# Patient Record
Sex: Male | Born: 1980 | Hispanic: Yes | Marital: Single | Smoking: Never smoker
Health system: Southern US, Community
[De-identification: ages and names within clinical notes are randomized; demographics above are authoritative.]

## PROBLEM LIST (undated history)

## (undated) DIAGNOSIS — K760 Fatty (change of) liver, not elsewhere classified: Secondary | ICD-10-CM

## (undated) DIAGNOSIS — R7989 Other specified abnormal findings of blood chemistry: Secondary | ICD-10-CM

## (undated) DIAGNOSIS — Z6833 Body mass index (BMI) 33.0-33.9, adult: Secondary | ICD-10-CM

## (undated) HISTORY — DX: Fatty (change of) liver, not elsewhere classified: K76.0

## (undated) HISTORY — DX: Other specified abnormal findings of blood chemistry: R79.89

## (undated) HISTORY — DX: Body mass index (BMI) 33.0-33.9, adult: Z68.33

## (undated) HISTORY — PX: NO PAST SURGERIES: SHX2092

---

## 2002-03-15 ENCOUNTER — Encounter: Payer: Self-pay | Admitting: Emergency Medicine

## 2002-03-15 ENCOUNTER — Emergency Department (HOSPITAL_COMMUNITY): Admission: EM | Admit: 2002-03-15 | Discharge: 2002-03-15 | Payer: Self-pay | Admitting: Emergency Medicine

## 2016-11-23 ENCOUNTER — Emergency Department (HOSPITAL_COMMUNITY)
Admission: EM | Admit: 2016-11-23 | Discharge: 2016-11-23 | Disposition: A | Discharge status: Home / Self Care | Payer: Managed Care, Other (non HMO) | Attending: Emergency Medicine | Admitting: Emergency Medicine

## 2016-11-23 ENCOUNTER — Encounter (HOSPITAL_COMMUNITY): Payer: Self-pay | Admitting: *Deleted

## 2016-11-23 DIAGNOSIS — L0291 Cutaneous abscess, unspecified: Secondary | ICD-10-CM

## 2016-11-23 DIAGNOSIS — L02215 Cutaneous abscess of perineum: Secondary | ICD-10-CM | POA: Diagnosis present

## 2016-11-23 MED ORDER — AMOXICILLIN-POT CLAVULANATE 875-125 MG PO TABS
1.0000 | ORAL_TABLET | Freq: Once | ORAL | Status: AC
  Administered 2016-11-23: 1 via ORAL
  Filled 2016-11-23: qty 1

## 2016-11-23 MED ORDER — LIDOCAINE HCL (PF) 1 % IJ SOLN
10.0000 mL | Freq: Once | INTRAMUSCULAR | Status: DC
  Filled 2016-11-23: qty 10

## 2016-11-23 MED ORDER — AMOXICILLIN-POT CLAVULANATE 875-125 MG PO TABS: 1.0000 | ORAL_TABLET | Freq: Two times a day (BID) | ORAL | 0 refills | Status: DC

## 2016-11-23 MED ORDER — LIDOCAINE HCL (PF) 2 % IJ SOLN
INTRAMUSCULAR | Status: AC
  Administered 2016-11-23: 10 mL
  Filled 2016-11-23: qty 10

## 2016-11-23 MED ORDER — OXYCODONE-ACETAMINOPHEN 5-325 MG PO TABS: 1.0000 | ORAL_TABLET | Freq: Three times a day (TID) | ORAL | 0 refills | Status: DC | PRN

## 2016-11-23 MED ORDER — OXYCODONE-ACETAMINOPHEN 5-325 MG PO TABS
1.0000 | ORAL_TABLET | Freq: Once | ORAL | Status: AC
  Administered 2016-11-23: 1 via ORAL
  Filled 2016-11-23: qty 1

## 2016-11-23 NOTE — ED Provider Notes (Signed)
[facility]PT Provider Note   CSN: [id number] Arrival date & time: 11/23/16  2146     History   Chief Complaint Chief Complaint  Patient presents with  . Abscess    HPI Adam Zimmerman is a 36 y.o. male.   Abscess  Location:  Pelvis Pelvic abscess location:  Perineum Size:  3x1 Abscess quality: fluctuance, induration, painful, redness and warmth   Red streaking: no   Duration:  5 days Progression:  Worsening Pain details:    Quality:  Sharp and dull   Severity:  Mild   Timing:  Constant   Progression:  Worsening Chronicity:  New   History reviewed. No pertinent past medical history.  There are no active problems to display for this patient.   History reviewed. No pertinent surgical history.     Home Medications    Prior to Admission medications   Medication Sig Start Date End Date Taking? Authorizing Provider  doxycycline (VIBRA-TABS) 100 MG tablet Take 100 mg by mouth 2 (two) times daily. Take 1 tablet twice a day for 10 days   Yes [provider]  ibuprofen (ADVIL,MOTRIN) 800 MG tablet Take 600 mg by mouth every 6 (six) hours as needed for headache or moderate pain.   Yes [provider]  amoxicillin-clavulanate (AUGMENTIN) 875-125 MG tablet Take 1 tablet by mouth 2 (two) times daily. One po bid x 7 days 11/23/16   Keilyn Haggard, Barbara CowerJason, MD  oxyCODONE-acetaminophen (PERCOCET) 5-325 MG tablet Take 1-2 tablets by mouth every 8 (eight) hours as needed for severe pain. 11/23/16   Avenell Sellers, Barbara CowerJason, MD    Family History No family history on file.  Social History Social History  Substance Use Topics  . Smoking status: Never Smoker  . Smokeless tobacco: Never Used  . Alcohol use Not on file     Allergies   Patient has no known allergies.   Review of Systems Review of Systems  All other systems reviewed and are negative.    Physical Exam Updated Vital Signs BP (!) 153/79   Pulse 92   Temp 98.2 F (36.8 C) (Oral)   Resp (!) 156   SpO2  97%   Physical Exam  Constitutional: He appears well-developed and well-nourished.  HENT:  Head: Normocephalic and atraumatic.  Eyes: Conjunctivae and EOM are normal.  Neck: Normal range of motion.  Cardiovascular: Normal rate.   Pulmonary/Chest: Effort normal. No respiratory distress.  Abdominal: Soft. Bowel sounds are normal. He exhibits no distension. There is no tenderness.  Genitourinary:    Right testis shows no mass. Left testis shows no mass.  Genitourinary Comments: Small area of fluctuance in area of exclamation point Surrounding erythema, edema, tenderness   Musculoskeletal: Normal range of motion.  Neurological: He is alert.  Nursing note and vitals reviewed.    ED Treatments / Results  Labs (all labs ordered are listed, but only abnormal results are displayed) Labs Reviewed - No data to display  EKG  EKG Interpretation None       Radiology No results found.  Procedures .Marland Kitchen.Incision and Drainage Date/Time: 11/23/2016 10:44 PM Performed by: Marily MemosMESNER, Hameed Kolar Authorized by: Marily MemosMESNER, Sumayya Muha   Consent:    Consent obtained:  Verbal   Consent given by:  Patient   Risks discussed:  Bleeding, incomplete drainage, infection and pain   Alternatives discussed:  No treatment Location:    Type:  Abscess   Size:  1X3   Location:  Anogenital   Anogenital location:  Perineum Pre-procedure details:  Skin preparation:  Antiseptic wash Anesthesia (see MAR for exact dosages):    Anesthesia method:  Local infiltration   Local anesthetic:  Lidocaine 1% w/o epi Procedure type:    Complexity:  Simple Procedure details:    Needle aspiration: yes     Needle size:  18 G   Incision types:  Stab incision   Scalpel blade:  11   Drainage:  Bloody and purulent   Drainage amount:  Moderate   Wound treatment:  Wound left open   Packing materials:  None Post-procedure details:    Patient tolerance of procedure:  Tolerated well, no immediate complications   EMERGENCY  DEPARTMENT US SOFT TISSUE INTERPRETATION "Study: Limited Soft Tissue Ultrasound"  INDICATIONS: Pain and Soft tissue infection Multiple views of the body part were obtained in real-time with a multi-frequency linear probe  PERFORMED BY: Myself IMAGES ARCHIVED?: Yes SIDE:Right  BODY PART:perineum INTERPRETATION:  Abcess present and Cellulitis present     Medications Ordered in ED Medications  lidocaine (PF) (XYLOCAINE) 1 % injection 10 mL (10 mLs Intradermal Not Given 11/23/16 2223)  amoxicillin-clavulanate (AUGMENTIN) 875-125 MG per tablet 1 tablet (not administered)  oxyCODONE-acetaminophen (PERCOCET/ROXICET) 5-325 MG per tablet 1 tablet (1 tablet Oral Given 11/23/16 2222)  lidocaine (XYLOCAINE) 2 % injection (10 mLs  Given 11/23/16 2223)     Initial Impression / Assessment and Plan / ED Course  I have reviewed the triage vital signs and the nursing notes.  Pertinent labs & imaging results that were available during my care of the patient were reviewed by me and considered in my medical decision making (see chart for details).   Perineal abscess, on doxy for a day. Discussed with Dr. Lovell Sheehan and will make small stab incisiion to start draining process and he will follow up Tuesday. Requested adding on augmentin to antibiotics. Will also give rx for pain meds as well.   Final Clinical Impressions(s) / ED Diagnoses   Final diagnoses:  Abscess    New Prescriptions New Prescriptions   AMOXICILLIN-CLAVULANATE (AUGMENTIN) 875-125 MG TABLET    Take 1 tablet by mouth 2 (two) times daily. One po bid x 7 days   OXYCODONE-ACETAMINOPHEN (PERCOCET) 5-325 MG TABLET    Take 1-2 tablets by mouth every 8 (eight) hours as needed for severe pain.     Marily Memos, MD 11/23/16 2249

## 2016-11-23 NOTE — ED Triage Notes (Signed)
Pt c/o rectal abscess that started a few days ago, [place] on doxycycline but pt reports that the area has spread,

## 2016-11-25 ENCOUNTER — Encounter: Payer: Self-pay | Admitting: General Surgery

## 2016-11-25 ENCOUNTER — Ambulatory Visit (INDEPENDENT_AMBULATORY_CARE_PROVIDER_SITE_OTHER): Payer: Managed Care, Other (non HMO) | Admitting: General Surgery

## 2016-11-25 VITALS — BP 133/79 | HR 86 | Temp 98.6°F | Resp 18 | Ht 66.0 in | Wt 203.0 lb

## 2016-11-25 DIAGNOSIS — L02215 Cutaneous abscess of perineum: Secondary | ICD-10-CM | POA: Diagnosis not present

## 2016-11-26 NOTE — Progress Notes (Signed)
Adam Zimmerman; [id number]; 11/29/1980   HPI Patient is a 36 year old Hispanic male who underwent incision and drainage of a perineal abscess in the emergency room on 11/23/2016. He was referred by the emergency room for follow-up wound check. Patient states that the drainage from the wound has decreased significantly over the past 24 hours. He denies any fevers. He just got his antibiotics filled. He denies any pain at the present time. He has never had this happen before. No past medical history on file.  No past surgical history on file.  No family history on file.  Current Outpatient Prescriptions on File Prior to Visit  Medication Sig Dispense Refill  . amoxicillin-clavulanate (AUGMENTIN) 875-125 MG tablet Take 1 tablet by mouth 2 (two) times daily. One po bid x 7 days 14 tablet 0  . doxycycline (VIBRA-TABS) 100 MG tablet Take 100 mg by mouth 2 (two) times daily. Take 1 tablet twice a day for 10 days    . ibuprofen (ADVIL,MOTRIN) 800 MG tablet Take 600 mg by mouth every 6 (six) hours as needed for headache or moderate pain.    Marland Kitchen. oxyCODONE-acetaminophen (PERCOCET) 5-325 MG tablet Take 1-2 tablets by mouth every 8 (eight) hours as needed for severe pain. 15 tablet 0   No current facility-administered medications on file prior to visit.     No Known Allergies  History  Alcohol use Not on file    History  Smoking Status  . Never Smoker  Smokeless Tobacco  . Never Used    Review of Systems  Constitutional: Negative.   HENT: Negative.   Eyes: Negative.   Respiratory: Negative.   Cardiovascular: Negative.   Gastrointestinal: Positive for heartburn.  Musculoskeletal: Negative.   Skin: Negative.   Neurological: Negative.   Endo/Heme/Allergies: Negative.   Psychiatric/Behavioral: Negative.     Objective   Vitals:   11/25/16 1414  BP: 133/79  Pulse: 86  Resp: 18  Temp: 98.6 F (37 C)    Physical Exam  Constitutional: He is well-developed, well-nourished, and in no  distress.  HENT:  Head: Normocephalic and atraumatic.  Cardiovascular: Normal rate, regular rhythm and normal heart sounds.   No murmur heard. Pulmonary/Chest: Effort normal and breath sounds normal. He has no wheezes. He has no rales.  Genitourinary:  Genitourinary Comments: Perineal wound healing well without induration or drainage.  Neurological: He is alert.  Skin: Skin is warm and dry.  Vitals reviewed.  ER note reviewed. Assessment  Perineal abscess, resolving Plan   Finish antibiotic course. Sitz baths as needed. Follow-up here as needed.

## 2018-04-09 ENCOUNTER — Emergency Department (HOSPITAL_COMMUNITY)
Admission: EM | Admit: 2018-04-09 | Discharge: 2018-04-09 | Disposition: A | Discharge status: Home / Self Care | Payer: Managed Care, Other (non HMO) | Attending: Emergency Medicine | Admitting: Emergency Medicine

## 2018-04-09 ENCOUNTER — Emergency Department (HOSPITAL_COMMUNITY): Payer: Managed Care, Other (non HMO)

## 2018-04-09 ENCOUNTER — Other Ambulatory Visit: Payer: Self-pay

## 2018-04-09 ENCOUNTER — Encounter (HOSPITAL_COMMUNITY): Payer: Self-pay | Admitting: Emergency Medicine

## 2018-04-09 DIAGNOSIS — R1032 Left lower quadrant pain: Secondary | ICD-10-CM | POA: Diagnosis present

## 2018-04-09 DIAGNOSIS — R1030 Lower abdominal pain, unspecified: Secondary | ICD-10-CM

## 2018-04-09 LAB — CBC WITH DIFFERENTIAL/PLATELET
ABS IMMATURE GRANULOCYTES: 0.01 10*3/uL (ref 0.00–0.07)
Basophils Absolute: 0.1 10*3/uL (ref 0.0–0.1)
Basophils Relative: 1 %
Eosinophils Absolute: 0.3 10*3/uL (ref 0.0–0.5)
Eosinophils Relative: 4 %
HEMATOCRIT: 44.6 % (ref 39.0–52.0)
HEMOGLOBIN: 14.5 g/dL (ref 13.0–17.0)
Immature Granulocytes: 0 %
LYMPHS ABS: 3.8 10*3/uL (ref 0.7–4.0)
Lymphocytes Relative: 48 %
MCH: 29.2 pg (ref 26.0–34.0)
MCHC: 32.5 g/dL (ref 30.0–36.0)
MCV: 89.9 fL (ref 80.0–100.0)
Monocytes Absolute: 0.5 10*3/uL (ref 0.1–1.0)
Monocytes Relative: 6 %
NEUTROS ABS: 3.2 10*3/uL (ref 1.7–7.7)
Neutrophils Relative %: 41 %
Platelets: 309 10*3/uL (ref 150–400)
RBC: 4.96 MIL/uL (ref 4.22–5.81)
RDW: 13.2 % (ref 11.5–15.5)
WBC: 7.8 10*3/uL (ref 4.0–10.5)
nRBC: 0 % (ref 0.0–0.2)

## 2018-04-09 LAB — URINALYSIS, ROUTINE W REFLEX MICROSCOPIC
Bilirubin Urine: NEGATIVE
GLUCOSE, UA: NEGATIVE mg/dL
HGB URINE DIPSTICK: NEGATIVE
KETONES UR: NEGATIVE mg/dL
LEUKOCYTES UA: NEGATIVE
Nitrite: NEGATIVE
PH: 6 (ref 5.0–8.0)
Protein, ur: NEGATIVE mg/dL
Specific Gravity, Urine: 1.015 (ref 1.005–1.030)

## 2018-04-09 LAB — COMPREHENSIVE METABOLIC PANEL
ALK PHOS: 47 U/L (ref 38–126)
ALT: 79 U/L — AB (ref 0–44)
AST: 38 U/L (ref 15–41)
Albumin: 4.6 g/dL (ref 3.5–5.0)
Anion gap: 7 (ref 5–15)
BILIRUBIN TOTAL: 0.7 mg/dL (ref 0.3–1.2)
BUN: 17 mg/dL (ref 6–20)
CO2: 26 mmol/L (ref 22–32)
CREATININE: 1.04 mg/dL (ref 0.61–1.24)
Calcium: 9.2 mg/dL (ref 8.9–10.3)
Chloride: 107 mmol/L (ref 98–111)
GFR calc Af Amer: >60 mL/min (ref >60)
Glucose, Bld: 106 mg/dL — ABNORMAL HIGH (ref 70–99)
Potassium: 3.5 mmol/L (ref 3.5–5.1)
Sodium: 140 mmol/L (ref 135–145)
TOTAL PROTEIN: 8 g/dL (ref 6.5–8.1)

## 2018-04-09 MED ORDER — ONDANSETRON HCL 4 MG/2ML IJ SOLN
4.0000 mg | Freq: Once | INTRAMUSCULAR | Status: AC
  Administered 2018-04-09: 4 mg via INTRAVENOUS
  Filled 2018-04-09: qty 2

## 2018-04-09 MED ORDER — IBUPROFEN 800 MG PO TABS: 800.0000 mg | ORAL_TABLET | Freq: Three times a day (TID) | ORAL | 0 refills | Status: DC | PRN

## 2018-04-09 MED ORDER — KETOROLAC TROMETHAMINE 30 MG/ML IJ SOLN
30.0000 mg | Freq: Once | INTRAMUSCULAR | Status: AC
  Administered 2018-04-09: 30 mg via INTRAVENOUS
  Filled 2018-04-09: qty 1

## 2018-04-09 NOTE — ED Triage Notes (Signed)
Pt c/o left sided flank pain that goes around to his back x 2 hours, denies n/v/d/fever

## 2018-04-09 NOTE — ED Provider Notes (Addendum)
[facility] EMERGENCY DEPARTMENT Provider Note   CSN: [id number] Arrival date & time: 04/09/18  1943     History   Chief Complaint Chief Complaint  Patient presents with  . Flank Pain    HPI Adam Zimmerman is a 37 y.o. male.  Patient complains of left lower quadrant and left flank pain.  Moderate to severe.  The history is provided by the patient. No language interpreter was used.  Flank Pain  This is a new problem. The current episode started more than 2 days ago. The problem occurs constantly. The problem has not changed since onset.Associated symptoms include abdominal pain. Pertinent negatives include no chest pain and no headaches. Nothing aggravates the symptoms. Nothing relieves the symptoms. He has tried nothing for the symptoms. The treatment provided no relief.    History reviewed. No pertinent past medical history.  There are no active problems to display for this patient.   History reviewed. No pertinent surgical history.      Home Medications    Prior to Admission medications   Medication Sig Start Date End Date Taking? Authorizing Provider  UNKNOWN TO PATIENT Take 1 tablet by mouth 2 (two) times daily. 10 day course completed on 04/08/2018-Antibiotic--Name is unknown    [provider]    Family History History reviewed. No pertinent family history.  Social History Social History   Tobacco Use  . Smoking status: Never Smoker  . Smokeless tobacco: Never Used  Substance Use Topics  . Alcohol use: Yes    Comment: occassional   . Drug use: Never     Allergies   Amoxicillin   Review of Systems Review of Systems  Constitutional: Negative for appetite change and fatigue.  HENT: Negative for congestion, ear discharge and sinus pressure.   Eyes: Negative for discharge.  Respiratory: Negative for cough.   Cardiovascular: Negative for chest pain.  Gastrointestinal: Positive for abdominal pain. Negative for diarrhea.  Genitourinary:  Positive for flank pain. Negative for frequency and hematuria.  Musculoskeletal: Negative for back pain.  Skin: Negative for rash.  Neurological: Negative for seizures and headaches.  Psychiatric/Behavioral: Negative for hallucinations.     Physical Exam Updated Vital Signs BP (!) 142/79 (BP Location: Right Arm)   Pulse 68   Temp 98.3 F (36.8 C) (Oral)   Resp 18   Ht 5\' 7"  (1.702 m)   Wt 95.3 kg   SpO2 99%   BMI 32.89 kg/m   Physical Exam  Constitutional: He is oriented to person, place, and time. He appears well-developed.  HENT:  Head: Normocephalic.  Eyes: Conjunctivae and EOM are normal. No scleral icterus.  Neck: Neck supple. No thyromegaly present.  Cardiovascular: Normal rate and regular rhythm. Exam reveals no gallop and no friction rub.  No murmur heard. Pulmonary/Chest: No stridor. He has no wheezes. He has no rales. He exhibits no tenderness.  Abdominal: He exhibits no distension. There is tenderness. There is no rebound.  Tenderness to left lower quadrant  Genitourinary:  Genitourinary Comments: The left flank  Musculoskeletal: Normal range of motion. He exhibits no edema.  Lymphadenopathy:    He has no cervical adenopathy.  Neurological: He is oriented to person, place, and time. He exhibits normal muscle tone. Coordination normal.  Skin: No rash noted. No erythema.  Psychiatric: He has a normal mood and affect. His behavior is normal.     ED Treatments / Results  Labs (all labs ordered are listed, but only abnormal results are displayed) Labs Reviewed  COMPREHENSIVE METABOLIC PANEL - Abnormal; Notable for the following components:      Result Value   Glucose, Bld 106 (*)    ALT 79 (*)    All other components within normal limits  URINALYSIS, ROUTINE W REFLEX MICROSCOPIC  CBC WITH DIFFERENTIAL/PLATELET    EKG None  Radiology No results found.  Procedures Procedures (including critical care time)  Medications Ordered in ED Medications    ketorolac (TORADOL) 30 MG/ML injection 30 mg (30 mg Intravenous Given 04/09/18 2037)  ondansetron (ZOFRAN) injection 4 mg (4 mg Intravenous Given 04/09/18 2037)     Initial Impression / Assessment and Plan / ED Course  I have reviewed the triage vital signs and the nursing notes.  Pertinent labs & imaging results that were available during my care of the patient were reviewed by me and considered in my medical decision making (see chart for details).     Patient with left flank pain.  CT scan pending labs unremarkable CT scan negative.  Patient will be placed on Motrin for discomfort.  Suspect muscle skeletal pain Final Clinical Impressions(s) / ED Diagnoses   Final diagnoses:  None    ED Discharge Orders    None       Bethann Berkshire, MD 04/09/18 2200    Bethann Berkshire, MD 04/09/18 2215

## 2018-04-09 NOTE — ED Notes (Signed)
Patient transported to CT 

## 2018-04-09 NOTE — Discharge Instructions (Addendum)
Follow-up with Dr. Jena Gaussourk in the next few weeks if not improving

## 2019-01-17 ENCOUNTER — Other Ambulatory Visit: Payer: Self-pay

## 2019-01-17 DIAGNOSIS — Z20822 Contact with and (suspected) exposure to covid-19: Secondary | ICD-10-CM

## 2019-01-18 LAB — NOVEL CORONAVIRUS, NAA: SARS-CoV-2, NAA: NOT DETECTED

## 2019-06-21 ENCOUNTER — Other Ambulatory Visit: Payer: Self-pay

## 2019-06-21 ENCOUNTER — Ambulatory Visit
Admission: EM | Admit: 2019-06-21 | Discharge: 2019-06-21 | Disposition: A | Discharge status: Home / Self Care | Payer: Managed Care, Other (non HMO) | Attending: Emergency Medicine | Admitting: Emergency Medicine

## 2019-06-21 DIAGNOSIS — Z20822 Contact with and (suspected) exposure to covid-19: Secondary | ICD-10-CM | POA: Diagnosis not present

## 2019-06-21 MED ORDER — BENZONATATE 100 MG PO CAPS: 100.0000 mg | ORAL_CAPSULE | Freq: Three times a day (TID) | ORAL | 0 refills | Status: DC

## 2019-06-21 NOTE — ED Provider Notes (Signed)
[facility]    CSN: [id number] Arrival date & time: 06/21/19  1909      History   Chief Complaint Chief Complaint  Patient presents with  . Cough    HPI Adam Zimmerman is a 39 y.o. male.   who presents with a complaint of body aches for 2 days and cough that started today.  Denies sick exposure to COVID, flu or strep.  Denies recent travel.  Denies aggravating or alleviating symptoms.  Denies previous COVID infection.   Denies fever, chills, fatigue, nasal congestion, rhinorrhea, sore throat, SOB, wheezing, chest pain, nausea, vomiting, changes in bowel or bladder habits.    The history is provided by the patient. No language interpreter was used.    History reviewed. No pertinent past medical history.  There are no problems to display for this patient.   History reviewed. No pertinent surgical history.     Home Medications    Prior to Admission medications   Medication Sig Start Date End Date Taking? Authorizing Provider  benzonatate (TESSALON) 100 MG capsule Take 1 capsule (100 mg total) by mouth every 8 (eight) hours. 06/21/19   Kaven Cumbie, Darrelyn Hillock, FNP  ibuprofen (ADVIL,MOTRIN) 800 MG tablet Take 1 tablet (800 mg total) by mouth every 8 (eight) hours as needed. 04/09/18   Milton Ferguson, MD  UNKNOWN TO PATIENT Take 1 tablet by mouth 2 (two) times daily. 10 day course completed on 04/08/2018-Antibiotic--Name is unknown    [provider]    Family History Family History  Problem Relation Age of Onset  . Healthy Mother   . Healthy Father     Social History Social History   Tobacco Use  . Smoking status: Never Smoker  . Smokeless tobacco: Never Used  Substance Use Topics  . Alcohol use: Yes    Comment: occassional   . Drug use: Never     Allergies   Amoxicillin   Review of Systems Review of Systems  Constitutional: Negative.   HENT: Negative.   Respiratory: Positive for cough.   Cardiovascular: Negative.   Gastrointestinal:  Negative.   Musculoskeletal:       Body aches  Neurological: Negative.      Physical Exam Triage Vital Signs ED Triage Vitals  Enc Vitals Group     BP      Pulse      Resp      Temp      Temp src      SpO2      Weight      Height      Head Circumference      Peak Flow      Pain Score      Pain Loc      Pain Edu?      Excl. in [place]?    No data found.  Updated Vital Signs BP 127/82 (BP Location: Right Arm)   Pulse 88   Temp 98.6 F (37 C) (Oral)   Resp 16   SpO2 95%   Visual Acuity Right Eye Distance:   Left Eye Distance:   Bilateral Distance:    Right Eye Near:   Left Eye Near:    Bilateral Near:     Physical Exam Vitals and nursing note reviewed.  Constitutional:      General: He is not in acute distress.    Appearance: Normal appearance. He is normal weight. He is not ill-appearing or toxic-appearing.  HENT:     Head: Normocephalic.  Right Ear: Tympanic membrane, ear canal and external ear normal. There is no impacted cerumen.     Left Ear: Tympanic membrane, ear canal and external ear normal. There is no impacted cerumen.     Nose: Nose normal. No congestion.     Mouth/Throat:     Mouth: Mucous membranes are moist.     Pharynx: Oropharynx is clear. No oropharyngeal exudate or posterior oropharyngeal erythema.  Cardiovascular:     Rate and Rhythm: Normal rate and regular rhythm.     Pulses: Normal pulses.     Heart sounds: Normal heart sounds. No murmur.  Pulmonary:     Effort: Pulmonary effort is normal. No respiratory distress.     Breath sounds: Normal breath sounds. No wheezing or rhonchi.  Chest:     Chest wall: No tenderness.  Abdominal:     General: Abdomen is flat. Bowel sounds are normal. There is no distension.     Palpations: There is no mass.     Tenderness: There is no abdominal tenderness.  Skin:    Capillary Refill: Capillary refill takes less than 2 seconds.  Neurological:     General: No focal deficit present.     Mental  Status: He is alert and oriented to person, place, and time.      UC Treatments / Results  Labs (all labs ordered are listed, but only abnormal results are displayed) Labs Reviewed  NOVEL CORONAVIRUS, NAA    EKG   Radiology No results found.  Procedures Procedures (including critical care time)  Medications Ordered in UC Medications - No data to display  Initial Impression / Assessment and Plan / UC Course  I have reviewed the triage vital signs and the nursing notes.  Pertinent labs & imaging results that were available during my care of the patient were reviewed by me and considered in my medical decision making (see chart for details).     COVID-19 test was ordered Tessalon Perles prescribed for cough Advised patient to quarantine To go to ED for worsening of symptoms Patient verbalized an understanding of the plan of care  Final Clinical Impressions(s) / UC Diagnoses   Final diagnoses:  Suspected COVID-19 virus infection     Discharge Instructions     COVID testing ordered.  It will take between 5-7 days for test results.  Someone will contact you regarding abnormal results.    In the meantime: You should remain isolated in your home for 10 days from symptom onset AND greater than 72 hours after symptoms resolution (absence of fever without the use of fever-reducing medication and improvement in respiratory symptoms), whichever is longer Get plenty of rest and push fluids Use medications daily for symptom relief Use OTC medications like ibuprofen or tylenol as needed fever or pain Call or go to the ED if you have any new or worsening symptoms such as fever, worsening cough, shortness of breath, chest tightness, chest pain, turning blue, changes in mental status, etc...     ED Prescriptions    Medication Sig Dispense Auth. Provider   benzonatate (TESSALON) 100 MG capsule Take 1 capsule (100 mg total) by mouth every 8 (eight) hours. 21 capsule Juanice Warburton,  Zachery Dakins, FNP     PDMP not reviewed this encounter.   Durward Parcel, FNP 06/21/19 1932

## 2019-06-21 NOTE — ED Triage Notes (Signed)
Pt presents to UC for covid test. Pt states he had body aches for 2 days last week. Pt sttes he's been coughing that started today.

## 2019-06-21 NOTE — Discharge Instructions (Addendum)

## 2019-06-22 LAB — NOVEL CORONAVIRUS, NAA: SARS-CoV-2, NAA: DETECTED — AB

## 2019-06-24 ENCOUNTER — Telehealth (HOSPITAL_COMMUNITY): Payer: Self-pay | Admitting: Emergency Medicine

## 2019-06-24 NOTE — Telephone Encounter (Signed)
Your test for COVID-19 was positive, meaning that you were infected with the novel coronavirus and could give the germ to others.  Please continue isolation at home for at least 10 days since the start of your symptoms. If you do not have symptoms, please isolate at home for 10 days from the day you were tested. Once you complete your 10 day quarantine, you may return to normal activities as long as you've not had a fever for over 24 hours(without taking fever reducing medicine) and your symptoms are improving. Please continue good preventive care measures, including:  frequent hand-washing, avoid touching your face, cover coughs/sneezes, stay out of crowds and keep a 6 foot distance from others.  Go to the nearest hospital emergency room if fever/cough/breathlessness are severe or illness seems like a threat to life.  With spanish interpreter, Patient contacted by phone and made aware of    results. Pt verbalized understanding and had all questions answered.

## 2020-01-30 ENCOUNTER — Ambulatory Visit
Admission: EM | Admit: 2020-01-30 | Discharge: 2020-01-30 | Disposition: A | Discharge status: Home / Self Care | Payer: Managed Care, Other (non HMO) | Attending: Emergency Medicine | Admitting: Emergency Medicine

## 2020-01-30 DIAGNOSIS — H60392 Other infective otitis externa, left ear: Secondary | ICD-10-CM

## 2020-01-30 MED ORDER — CIPROFLOXACIN-DEXAMETHASONE 0.3-0.1 % OT SUSP: 4.0000 [drp] | Freq: Two times a day (BID) | OTIC | 0 refills | Status: DC

## 2020-01-30 NOTE — ED Triage Notes (Signed)
Pt presents with c/o left ear pain that began yesterday, states he had green drainage

## 2020-01-30 NOTE — Discharge Instructions (Addendum)
Rest and drink plenty of fluids Prescribed Ciprodex ear drops Take medications as directed and to completion Continue to use OTC ibuprofen and/ or tylenol as needed for pain control Follow up with PCP if symptoms persists Return here or go to the ER if you have any new or worsening symptoms  

## 2020-01-30 NOTE — ED Provider Notes (Signed)
[facility]   [id number] 01/30/20 Arrival Time: 1009  CC:EAR PAIN  SUBJECTIVE: History from: patient.  Adam Zimmerman is a 39 y.o. male who presents with of left  ear pain for the past few days.  Denies a precipitating event, such as swimming or wearing ear plugs.  Patient states the pain is constant and achy in character.  Start OTC medication with no relief.  Symptoms are made worse with lying down.  Denies similar symptoms in the past.    Denies fever, chills, fatigue, sinus pain, rhinorrhea, ear discharge, sore throat, SOB, wheezing, chest pain, nausea, changes in bowel or bladder habits.    ROS: As per HPI.  All other pertinent ROS negative.     No past medical history on file. No past surgical history on file. Allergies  Allergen Reactions  . Amoxicillin Hives   No current facility-administered medications on file prior to encounter.   Current Outpatient Medications on File Prior to Encounter  Medication Sig Dispense Refill  . benzonatate (TESSALON) 100 MG capsule Take 1 capsule (100 mg total) by mouth every 8 (eight) hours. 21 capsule 0  . ibuprofen (ADVIL,MOTRIN) 800 MG tablet Take 1 tablet (800 mg total) by mouth every 8 (eight) hours as needed. 20 tablet 0  . UNKNOWN TO PATIENT Take 1 tablet by mouth 2 (two) times daily. 10 day course completed on 04/08/2018-Antibiotic--Name is unknown     Social History   Socioeconomic History  . Marital status: Single    Spouse name: Not on file  . Number of children: Not on file  . Years of education: Not on file  . Highest education level: Not on file  Occupational History  . Not on file  Tobacco Use  . Smoking status: Never Smoker  . Smokeless tobacco: Never Used  Vaping Use  . Vaping Use: Never used  Substance and Sexual Activity  . Alcohol use: Yes    Comment: occassional   . Drug use: Never  . Sexual activity: Not on file  Other Topics Concern  . Not on file  Social History Narrative  . Not on file    Social Determinants of Health   Financial Resource Strain:   . Difficulty of Paying Living Expenses: Not on file  Food Insecurity:   . Worried About Programme researcher, broadcasting/film/video in the Last Year: Not on file  . Ran Out of Food in the Last Year: Not on file  Transportation Needs:   . Lack of Transportation (Medical): Not on file  . Lack of Transportation (Non-Medical): Not on file  Physical Activity:   . Days of Exercise per Week: Not on file  . Minutes of Exercise per Session: Not on file  Stress:   . Feeling of Stress : Not on file  Social Connections:   . Frequency of Communication with Friends and Family: Not on file  . Frequency of Social Gatherings with Friends and Family: Not on file  . Attends Religious Services: Not on file  . Active Member of Clubs or Organizations: Not on file  . Attends Banker Meetings: Not on file  . Marital Status: Not on file  Intimate Partner Violence:   . Fear of Current or Ex-Partner: Not on file  . Emotionally Abused: Not on file  . Physically Abused: Not on file  . Sexually Abused: Not on file   Family History  Problem Relation Age of Onset  . Healthy Mother   . Healthy Father  OBJECTIVE:  Vitals:   01/30/20 1042  BP: (!) 149/107  Pulse: (!) 103  Resp: 20  Temp: 98.7 F (37.1 C)  SpO2: 97%     Physical Exam Vitals and nursing note reviewed.  Constitutional:      General: He is not in acute distress.    Appearance: Normal appearance. He is normal weight. He is not ill-appearing, toxic-appearing or diaphoretic.  HENT:     Right Ear: Tympanic membrane and external ear normal. There is no impacted cerumen.     Left Ear: Tympanic membrane and external ear normal. Swelling and tenderness present.     Ears:     Comments: Swelling left ear canal with tenderness Cardiovascular:     Rate and Rhythm: Normal rate and regular rhythm.     Pulses: Normal pulses.     Heart sounds: Normal heart sounds. No murmur heard.  No  friction rub. No gallop.   Pulmonary:     Effort: Pulmonary effort is normal. No respiratory distress.     Breath sounds: Normal breath sounds. No stridor. No wheezing, rhonchi or rales.  Chest:     Chest wall: No tenderness.  Neurological:     Mental Status: He is alert and oriented to person, place, and time.      Imaging: No results found.   ASSESSMENT & PLAN:  1. Infective otitis externa of left ear     Meds ordered this encounter  Medications  . ciprofloxacin-dexamethasone (CIPRODEX) OTIC suspension    Sig: Place 4 drops into the left ear 2 (two) times daily.    Dispense:  7.5 mL    Refill:  0   Discharge instructions  Rest and drink plenty of fluids Prescribed Ciprodex ear drops Take medications as directed and to completion Continue to use OTC ibuprofen and/ or tylenol as needed for pain control Follow up with PCP if symptoms persists Return here or go to the ER if you have any new or worsening symptoms   Reviewed expectations re: course of current medical issues. Questions answered. Outlined signs and symptoms indicating need for more acute intervention. Patient verbalized understanding. After Visit Summary given.      Note: This document was prepared using Dragon voice recognition software and may include unintentional dictation errors.    Durward Parcel, FNP 01/30/20 1109

## 2020-02-24 DIAGNOSIS — K6389 Other specified diseases of intestine: Secondary | ICD-10-CM

## 2020-02-24 HISTORY — DX: Other specified diseases of intestine: K63.89

## 2020-03-23 ENCOUNTER — Other Ambulatory Visit: Payer: Self-pay

## 2020-03-23 ENCOUNTER — Emergency Department (HOSPITAL_COMMUNITY): Payer: Managed Care, Other (non HMO)

## 2020-03-23 ENCOUNTER — Encounter (HOSPITAL_COMMUNITY): Payer: Self-pay | Admitting: Emergency Medicine

## 2020-03-23 ENCOUNTER — Inpatient Hospital Stay (HOSPITAL_COMMUNITY)
Admission: EM | Admit: 2020-03-23 | Discharge: 2020-03-25 | DRG: 872 | Disposition: A | Discharge status: Home / Self Care | Payer: Managed Care, Other (non HMO) | Attending: Internal Medicine | Admitting: Internal Medicine

## 2020-03-23 ENCOUNTER — Emergency Department: Payer: Self-pay

## 2020-03-23 DIAGNOSIS — R1032 Left lower quadrant pain: Secondary | ICD-10-CM | POA: Diagnosis present

## 2020-03-23 DIAGNOSIS — K760 Fatty (change of) liver, not elsewhere classified: Secondary | ICD-10-CM | POA: Diagnosis present

## 2020-03-23 DIAGNOSIS — Z114 Encounter for screening for human immunodeficiency virus [HIV]: Secondary | ICD-10-CM

## 2020-03-23 DIAGNOSIS — K529 Noninfective gastroenteritis and colitis, unspecified: Secondary | ICD-10-CM | POA: Diagnosis present

## 2020-03-23 DIAGNOSIS — Z20822 Contact with and (suspected) exposure to covid-19: Secondary | ICD-10-CM | POA: Diagnosis present

## 2020-03-23 DIAGNOSIS — R7401 Elevation of levels of liver transaminase levels: Secondary | ICD-10-CM

## 2020-03-23 DIAGNOSIS — K6389 Other specified diseases of intestine: Secondary | ICD-10-CM | POA: Diagnosis present

## 2020-03-23 DIAGNOSIS — A419 Sepsis, unspecified organism: Secondary | ICD-10-CM | POA: Diagnosis present

## 2020-03-23 DIAGNOSIS — E669 Obesity, unspecified: Secondary | ICD-10-CM | POA: Diagnosis present

## 2020-03-23 DIAGNOSIS — R109 Unspecified abdominal pain: Secondary | ICD-10-CM

## 2020-03-23 DIAGNOSIS — Z880 Allergy status to penicillin: Secondary | ICD-10-CM

## 2020-03-23 DIAGNOSIS — R7989 Other specified abnormal findings of blood chemistry: Secondary | ICD-10-CM

## 2020-03-23 DIAGNOSIS — Z6832 Body mass index (BMI) 32.0-32.9, adult: Secondary | ICD-10-CM | POA: Diagnosis not present

## 2020-03-23 LAB — RESPIRATORY PANEL BY RT PCR (FLU A&B, COVID)
Influenza A by PCR: NEGATIVE
Influenza B by PCR: NEGATIVE
SARS Coronavirus 2 by RT PCR: NEGATIVE

## 2020-03-23 LAB — CBC
HCT: 38.6 % — ABNORMAL LOW (ref 39.0–52.0)
Hemoglobin: 13.2 g/dL (ref 13.0–17.0)
MCH: 29.3 pg (ref 26.0–34.0)
MCHC: 34.2 g/dL (ref 30.0–36.0)
MCV: 85.8 fL (ref 80.0–100.0)
Platelets: 260 10*3/uL (ref 150–400)
RBC: 4.5 MIL/uL (ref 4.22–5.81)
RDW: 13 % (ref 11.5–15.5)
WBC: 9.5 10*3/uL (ref 4.0–10.5)
nRBC: 0 % (ref 0.0–0.2)

## 2020-03-23 LAB — COMPREHENSIVE METABOLIC PANEL
ALT: 92 U/L — ABNORMAL HIGH (ref 0–44)
AST: 45 U/L — ABNORMAL HIGH (ref 15–41)
Albumin: 5 g/dL (ref 3.5–5.0)
Alkaline Phosphatase: 49 U/L (ref 38–126)
Anion gap: 14 (ref 5–15)
BUN: 10 mg/dL (ref 6–20)
CO2: 22 mmol/L (ref 22–32)
Calcium: 9.9 mg/dL (ref 8.9–10.3)
Chloride: 101 mmol/L (ref 98–111)
Creatinine, Ser: 1.08 mg/dL (ref 0.61–1.24)
GFR, Estimated: >60 mL/min (ref >60)
Glucose, Bld: 103 mg/dL — ABNORMAL HIGH (ref 70–99)
Potassium: 3.6 mmol/L (ref 3.5–5.1)
Sodium: 137 mmol/L (ref 135–145)
Total Bilirubin: 0.9 mg/dL (ref 0.3–1.2)
Total Protein: 8.5 g/dL — ABNORMAL HIGH (ref 6.5–8.1)

## 2020-03-23 LAB — URINALYSIS, ROUTINE W REFLEX MICROSCOPIC
Bacteria, UA: NONE SEEN
Bilirubin Urine: NEGATIVE
Glucose, UA: NEGATIVE mg/dL
Hgb urine dipstick: NEGATIVE
Ketones, ur: NEGATIVE mg/dL
Nitrite: NEGATIVE
Protein, ur: NEGATIVE mg/dL
Specific Gravity, Urine: 1.015 (ref 1.005–1.030)
pH: 7 (ref 5.0–8.0)

## 2020-03-23 LAB — CBC WITH DIFFERENTIAL/PLATELET
Abs Immature Granulocytes: 0.03 10*3/uL (ref 0.00–0.07)
Basophils Absolute: 0.1 10*3/uL (ref 0.0–0.1)
Basophils Relative: 1 %
Eosinophils Absolute: 0 10*3/uL (ref 0.0–0.5)
Eosinophils Relative: 0 %
HCT: 49.6 % (ref 39.0–52.0)
Hemoglobin: 16.8 g/dL (ref 13.0–17.0)
Immature Granulocytes: 0 %
Lymphocytes Relative: 21 %
Lymphs Abs: 2.8 10*3/uL (ref 0.7–4.0)
MCH: 29.3 pg (ref 26.0–34.0)
MCHC: 33.9 g/dL (ref 30.0–36.0)
MCV: 86.4 fL (ref 80.0–100.0)
Monocytes Absolute: 0.4 10*3/uL (ref 0.1–1.0)
Monocytes Relative: 3 %
Neutro Abs: 10.1 10*3/uL — ABNORMAL HIGH (ref 1.7–7.7)
Neutrophils Relative %: 75 %
Platelets: 331 10*3/uL (ref 150–400)
RBC: 5.74 MIL/uL (ref 4.22–5.81)
RDW: 13.1 % (ref 11.5–15.5)
WBC: 13.4 10*3/uL — ABNORMAL HIGH (ref 4.0–10.5)
nRBC: 0 % (ref 0.0–0.2)

## 2020-03-23 LAB — PROTIME-INR
INR: 1.1 (ref 0.8–1.2)
Prothrombin Time: 13.6 seconds (ref 11.4–15.2)

## 2020-03-23 LAB — LIPASE, BLOOD: Lipase: 24 U/L (ref 11–51)

## 2020-03-23 LAB — HIV ANTIBODY (ROUTINE TESTING W REFLEX): HIV Screen 4th Generation wRfx: NONREACTIVE

## 2020-03-23 LAB — APTT: aPTT: 27 seconds (ref 24–36)

## 2020-03-23 LAB — LACTIC ACID, PLASMA
Lactic Acid, Venous: 2.6 mmol/L (ref 0.5–1.9)
Lactic Acid, Venous: 2.3 mmol/L (ref 0.5–1.9)
Lactic Acid, Venous: 1.4 mmol/L (ref 0.5–1.9)

## 2020-03-23 LAB — SAMPLE TO BLOOD BANK

## 2020-03-23 MED ORDER — METRONIDAZOLE IN NACL 5-0.79 MG/ML-% IV SOLN
500.0000 mg | Freq: Three times a day (TID) | INTRAVENOUS | Status: DC
  Administered 2020-03-24 – 2020-03-25 (×5): 500 mg via INTRAVENOUS
  Filled 2020-03-23 (×5): qty 100

## 2020-03-23 MED ORDER — SODIUM CHLORIDE 0.9 % IV SOLN: INTRAVENOUS | Status: AC

## 2020-03-23 MED ORDER — METRONIDAZOLE IN NACL 5-0.79 MG/ML-% IV SOLN
500.0000 mg | Freq: Once | INTRAVENOUS | Status: AC
  Administered 2020-03-23: 500 mg via INTRAVENOUS
  Filled 2020-03-23: qty 100

## 2020-03-23 MED ORDER — SODIUM CHLORIDE 0.9 % IV BOLUS
2000.0000 mL | Freq: Once | INTRAVENOUS | Status: AC
  Administered 2020-03-23: 2000 mL via INTRAVENOUS

## 2020-03-23 MED ORDER — LACTATED RINGERS IV BOLUS
1000.0000 mL | Freq: Once | INTRAVENOUS | Status: AC
  Administered 2020-03-23: 1000 mL via INTRAVENOUS

## 2020-03-23 MED ORDER — SODIUM CHLORIDE 0.9 % IV SOLN
2.0000 g | INTRAVENOUS | Status: DC
  Administered 2020-03-23 – 2020-03-25 (×3): 2 g via INTRAVENOUS
  Filled 2020-03-23 (×3): qty 20

## 2020-03-23 NOTE — ED Provider Notes (Signed)
[facility]   CSN: [id number] Arrival date & time: 03/23/20  1335     History Chief Complaint  Patient presents with  . abnormal ct    Adam Zimmerman is a 39 y.o. male with no pertinent past medical history who presents today for evaluation of an abnormal CT scan.  He was seen earlier today at outside facility and had a CT scan abdomen [facility].  This was found to be abnormal and he was sent here.  He reports that he has had abdominal pain for about a week.  He denies any fevers.  Mild nausea without vomiting.  No changes in bowel movements.  Has never had any prior abdominal surgeries.  No urinary symptoms.  HPI     History reviewed. No pertinent past medical history.  Patient Active Problem List   Diagnosis Date Noted  . 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 Enteritis 03/23/2020  . Elevated transaminase level 03/23/2020  . Encounter for screening for HIV 03/23/2020    History reviewed. No pertinent surgical history.     Family History  Problem Relation Age of Onset  . Healthy Mother   . Healthy Father     Social History   Tobacco Use  . Smoking status: Never Smoker  . Smokeless tobacco: Never Used  Vaping Use  . Vaping Use: Never used  Substance Use Topics  . Alcohol use: Yes    Comment: occassional   . Drug use: Never    Home Medications Prior to Admission medications   Medication Sig Start Date End Date Taking? Authorizing Provider  ciprofloxacin (CIPRO) 500 MG tablet Take 500 mg by mouth 2 (two) times daily. FOR 10 DAYS 03/16/20 03/26/20 Yes [provider]  metroNIDAZOLE (FLAGYL) 500 MG tablet Take 500 mg by mouth 2 (two) times daily. 03/16/20 03/26/20 Yes [provider]  benzonatate (TESSALON) 100 MG capsule Take 1 capsule (100 mg total) by mouth every 8 (eight) hours. Patient not taking: Reported on 03/23/2020 06/21/19   Durward ParcelAvegno, Komlanvi S, FNP    ciprofloxacin-dexamethasone (CIPRODEX) OTIC suspension Place 4 drops into the left ear 2 (two) times daily. Patient not taking: Reported on 03/23/2020 01/30/20   Durward ParcelAvegno, Komlanvi S, FNP  ibuprofen (ADVIL,MOTRIN) 800 MG tablet Take 1 tablet (800 mg total) by mouth every 8 (eight) hours as needed. Patient not taking: Reported on 03/23/2020 04/09/18   Bethann BerkshireZammit, Joseph, MD    Allergies    Amoxicillin  Review of Systems   Review of Systems  Constitutional: Negative for chills and fever.  Respiratory: Negative for chest tightness and shortness of breath.   Cardiovascular: Negative for chest pain.  Gastrointestinal: Positive for abdominal pain. Negative for diarrhea and vomiting.  Genitourinary: Negative for dysuria, flank pain, frequency, penile pain, testicular pain and urgency.  Musculoskeletal: Negative for back pain and neck pain.  Neurological: Negative for weakness and headaches.  All other systems reviewed and are negative.   Physical Exam Updated Vital Signs BP 121/77   Pulse 87   Temp 98.2 F (36.8 C) (Oral)   Resp 16   SpO2 97%   Physical Exam Vitals and nursing note reviewed.  Constitutional:      Appearance: He is well-developed.  HENT:     Head: Normocephalic and atraumatic.  Eyes:     Conjunctiva/sclera: Conjunctivae normal.  Cardiovascular:     Rate and Rhythm: Normal rate and regular rhythm.     Heart sounds: No murmur  heard.   Pulmonary:     Effort: Pulmonary effort is normal. No respiratory distress.     Breath sounds: Normal breath sounds.  Abdominal:     Palpations: Abdomen is soft.     Tenderness: There is no abdominal tenderness. There is no right CVA tenderness or left CVA tenderness.     Comments: Patient's abdominal pain does not change with palpation, no true tenderness to palpation.  Musculoskeletal:     Cervical back: Normal range of motion and neck supple.  Skin:    General: Skin is warm and dry.  Neurological:     Mental Status: He is alert.      Comments: Patient is awake and alert, answers questions appropriately.  Speech is not obviously slurred.  Psychiatric:        Mood and Affect: Mood normal.        Behavior: Behavior normal.     ED Results / Procedures / Treatments   Labs (all labs ordered are listed, but only abnormal results are displayed) Labs Reviewed  COMPREHENSIVE METABOLIC PANEL - Abnormal; Notable for the following components:      Result Value   Glucose, Bld 103 (*)    Total Protein 8.5 (*)    AST 45 (*)    ALT 92 (*)    All other components within normal limits  CBC WITH DIFFERENTIAL/PLATELET - Abnormal; Notable for the following components:   WBC 13.4 (*)    Neutro Abs 10.1 (*)    All other components within normal limits  LACTIC ACID, PLASMA - Abnormal; Notable for the following components:   Lactic Acid, Venous 2.6 (*)    All other components within normal limits  LACTIC ACID, PLASMA - Abnormal; Notable for the following components:   Lactic Acid, Venous 2.3 (*)    All other components within normal limits  URINALYSIS, ROUTINE W REFLEX MICROSCOPIC - Abnormal; Notable for the following components:   Leukocytes,Ua TRACE (*)    All other components within normal limits  RESPIRATORY PANEL BY RT PCR (FLU A&B, COVID)  CULTURE, BLOOD (SINGLE)  URINE CULTURE  LIPASE, BLOOD  PROTIME-INR  APTT  CBC  HIV ANTIBODY (ROUTINE TESTING W REFLEX)  COMPREHENSIVE METABOLIC PANEL  LACTIC ACID, PLASMA  SAMPLE TO BLOOD BANK    EKG None  Radiology DG Chest Port 1 View  Result Date: 03/23/2020 CLINICAL DATA:  Sepsis. EXAM: PORTABLE CHEST 1 VIEW COMPARISON:  03/23/2020 FINDINGS: The cardiac silhouette, mediastinal and hilar contours are normal. The lungs are clear. No pleural effusion. The bony thorax is intact. IMPRESSION: No acute cardiopulmonary findings. Electronically Signed   By: Rudie Meyer M.D.   On: 03/23/2020 18:32     CT abd pelvis obtained earlier today prior to patient's arrival here  directly copy and pasted from his [facility] chart. "1. Small areas of gas along the wall of the jejunum, some areas are  anti dependent and clearly outlined valvulae, other areas are  dependent and somewhat atypical for gas trapped in valvulae raising  the question of subtle, early pneumatosis. Some wall thickening may  be present but there is no evidence of mesenteric stranding, fluid  or portal venous gas. Vascular structures appear patent. Would  correlate with any risk factors for enteritis and with lactate to  exclude the possibility of early ischemia.  2. Marked hepatic steatosis. Signs of fatty sparing about the porta  hepatis with similar appearance to prior imaging.  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    These  results were called by telephone at the time of interpretation  on 03/23/2020 at 11:56 am to provider Tresa Res , who verbally  acknowledged these results. "     Electronically Signed  By: Donzetta Kohut M.D.  On: 03/23/2020 11:58   Procedures .Critical Care Performed by: Cristina Gong, PA-C Authorized by: Cristina Gong, PA-C   Critical care provider statement:    Critical care time (minutes):  45   Critical care was necessary to treat or prevent imminent or life-threatening deterioration of the following conditions:  Sepsis   Critical care was time spent personally by me on the following activities:  Discussions with consultants, evaluation of patient's response to treatment, examination of patient, ordering and performing treatments and interventions, ordering and review of laboratory studies, ordering and review of radiographic studies, pulse oximetry, re-evaluation of patient's condition, obtaining history from patient or surrogate and review of old charts   (including critical care time)  Medications Ordered in ED Medications  cefTRIAXone (ROCEPHIN) 2 g in sodium chloride 0.9 % 100 mL IVPB (0 g Intravenous Stopped 03/23/20 1925)    metroNIDAZOLE (FLAGYL) IVPB 500 mg (has no administration in time range)  0.9 %  sodium chloride infusion (has no administration in time range)  lactated ringers bolus 1,000 mL (0 mLs Intravenous Stopped 03/23/20 1910)  metroNIDAZOLE (FLAGYL) IVPB 500 mg (0 mg Intravenous Stopped 03/23/20 2012)  sodium chloride 0.9 % bolus 2,000 mL (2,000 mLs Intravenous New Bag/Given 03/23/20 2008)    ED Course  I have reviewed the triage vital signs and the nursing notes.  Pertinent labs & imaging results that were available during my care of the patient were reviewed by me and considered in my medical decision making (see chart for details).  Clinical Course as of Mar 24 2131  Fri Mar 23, 2020  1801 Per [facility] Radiology dept "Hi there.  so [place] tried to upload the disk for [employer] but it did not show images available.  I called [facility] and they are not able to power share to [facility] (that seems like a big miss) but they are going to call [facility] and get them to send images over if they can.  I think that's the best we can do.  I'll bring the disk back over and see if anyone is able to load it"   [EH]    Clinical Course User Index [EH] Norman Clay   MDM Rules/Calculators/A&P                          Patient is a 39 year old man who presents today for evaluation of about a week of abdominal pain.  He had a CT scan at outside facility today showing concern for pneumatosis.  Here patient's heart rate is consistently over 90 despite lack of significant pain, his white count is slightly elevated at 31.4 with neutrophils of 10.1.  CMP shows mild transaminitis however is otherwise unremarkable.  This combined with his initial lactic acid over 2 qualifies patient for inclusion in sepsis criteria.  He is given 2 L of IV fluids.  He did not require full 30/kg bolus as his lactic acid was not over 4 and he was not hypotensive.  With pharmacy consult he is started on Flagyl and  Rocephin.  General surgery is consulted who saw the patient in consult, recommends medical admission for continued observation.  I spoke with Dr. Loney Loh who will see the  patient for admission.    Note: Portions of this report may have been transcribed using voice recognition software. Every effort was made to ensure accuracy; however, inadvertent computerized transcription errors may be present   Final Clinical Impression(s) / ED Diagnoses Final diagnoses:  Abdominal pain    Rx / DC Orders ED Discharge Orders    None       Norman Clay 03/23/20 2135    Geoffery Lyons, MD 03/23/20 2356

## 2020-03-23 NOTE — Sepsis Progress Note (Signed)
ELink monitoring this Code Sepsis.

## 2020-03-23 NOTE — Consult Note (Signed)
Reason for Consult: ab pain Referring Physician: Dr Judd Lien  Adam Zimmerman is an 39 y.o. male.  HPI: 84 yom who has been told in past he has a couple episodes of diverticulitis in past without imaging treated with abx.  For past week he has had left sided pain and thought this was diverticulitis.  He has been treated with cipro/flagyl by pcp for past few days without improvement. He has bene eating. He has been having flatus and bms.  No n/v. No fevers. Pain was persisting and he went for a ct scan today. This is outside scan that I have reviewed disk and seen report.  The report is significant for small areas of gas along jejunal wall with some question of pneumatosis.  There are no other concerning findings. He reports he has not really hurting at all right now either.   History reviewed. No pertinent past medical history.  History reviewed. No pertinent surgical history.  Family History  Problem Relation Age of Onset  . Healthy Mother   . Healthy Father     Social History:  reports that he has never smoked. He has never used smokeless tobacco. He reports current alcohol use. He reports that he does not use drugs.  Allergies:  Allergies  Allergen Reactions  . Amoxicillin Hives    Medications: I have reviewed the patient's current medications.  Results for orders placed or performed during the hospital encounter of 03/23/20 (from the past 48 hour(s))  Respiratory Panel by RT PCR (Flu A&B, Covid) - Nasopharyngeal Swab     Status: None   Collection Time: 03/23/20  4:37 PM   Specimen: Nasopharyngeal Swab  Result Value Ref Range   SARS Coronavirus 2 by RT PCR NEGATIVE NEGATIVE    Comment: (NOTE) SARS-CoV-2 target nucleic acids are NOT DETECTED.  The SARS-CoV-2 RNA is generally detectable in upper respiratoy specimens during the acute phase of infection. The lowest concentration of SARS-CoV-2 viral copies this assay can detect is 131 copies/mL. A negative result does not preclude  SARS-Cov-2 infection and should not be used as the sole basis for treatment or other patient management decisions. A negative result may occur with  improper specimen collection/handling, submission of specimen other than nasopharyngeal swab, presence of viral mutation(s) within the areas targeted by this assay, and inadequate number of viral copies (<131 copies/mL). A negative result must be combined with clinical observations, patient history, and epidemiological information. The expected result is Negative.  Fact Sheet for Patients:  https://www.moore.com/  Fact Sheet for Healthcare Providers:  https://www.young.biz/  This test is no t yet approved or cleared by the [place] FDA and  has been authorized for detection and/or diagnosis of SARS-CoV-2 by FDA under an Emergency Use Authorization (EUA). This EUA will remain  in effect (meaning this test can be used) for the duration of the COVID-19 declaration under Section 564(b)(1) of the Act, 21 U.S.C. section 360bbb-3(b)(1), unless the authorization is terminated or revoked sooner.     Influenza A by PCR NEGATIVE NEGATIVE   Influenza B by PCR NEGATIVE NEGATIVE    Comment: (NOTE) The Xpert Xpress SARS-CoV-2/FLU/RSV assay is intended as an aid in  the diagnosis of influenza from Nasopharyngeal swab specimens and  should not be used as a sole basis for treatment. Nasal washings and  aspirates are unacceptable for Xpert Xpress SARS-CoV-2/FLU/RSV  testing.  Fact Sheet for Patients: https://www.moore.com/  Fact Sheet for Healthcare Providers: https://www.young.biz/  This test is not yet approved or cleared by  the [employer] and  has been authorized for detection and/or diagnosis of SARS-CoV-2 by  FDA under an Emergency Use Authorization (EUA). This EUA will remain  in effect (meaning this test can be used) for the duration of the  Covid-19  declaration under Section 564(b)(1) of the Act, 21  U.S.C. section 360bbb-3(b)(1), unless the authorization is  terminated or revoked. Performed at [facility], [address], [place], [place] [address]   Comprehensive metabolic panel     Status: Abnormal   Collection Time: 03/23/20  5:00 PM  Result Value Ref Range   Sodium 137 135 - 145 mmol/L   Potassium 3.6 3.5 - 5.1 mmol/L   Chloride 101 98 - 111 mmol/L   CO2 22 22 - 32 mmol/L   Glucose, Bld 103 (H) 70 - 99 mg/dL    Comment: Glucose reference range applies only to samples taken after fasting for at least 8 hours.   BUN 10 6 - 20 mg/dL   Creatinine, Ser 6.73 0.61 - 1.24 mg/dL   Calcium 9.9 8.9 - 41.9 mg/dL   Total Protein 8.5 (H) 6.5 - 8.1 g/dL   Albumin 5.0 3.5 - 5.0 g/dL   AST 45 (H) 15 - 41 U/L   ALT 92 (H) 0 - 44 U/L   Alkaline Phosphatase 49 38 - 126 U/L   Total Bilirubin 0.9 0.3 - 1.2 mg/dL   GFR, Estimated >37 >90 mL/min    Comment: (NOTE) Calculated using the CKD-EPI Creatinine Equation (2021)    Anion gap 14 5 - 15    Comment: Performed at [facility], [address], [place], [place] [address]  Lipase, blood     Status: None   Collection Time: 03/23/20  5:00 PM  Result Value Ref Range   Lipase 24 11 - 51 U/L    Comment: Performed at [facility], [address] [place], [place] [address]  CBC with Differential     Status: Abnormal   Collection Time: 03/23/20  5:00 PM  Result Value Ref Range   WBC 13.4 (H) 4.0 - 10.5 K/uL   RBC 5.74 4.22 - 5.81 MIL/uL   Hemoglobin 16.8 13.0 - 17.0 g/dL   HCT 92.4 39 - 52 %   MCV 86.4 80.0 - 100.0 fL   MCH 29.3 26.0 - 34.0 pg   MCHC 33.9 30.0 - 36.0 g/dL   RDW 26.8 34.1 - 96.2 %   Platelets 331 150 - 400 K/uL   nRBC 0.0 0.0 - 0.2 %   Neutrophils Relative % 75 %   Neutro Abs 10.1 (H) 1.7 - 7.7 K/uL   Lymphocytes Relative 21 %   Lymphs Abs 2.8 0.7 - 4.0 K/uL   Monocytes Relative 3 %   Monocytes Absolute 0.4 0.1 - 1.0 K/uL   Eosinophils Relative 0 %    Eosinophils Absolute 0.0 0.0 - 0.5 K/uL   Basophils Relative 1 %   Basophils Absolute 0.1 0.0 - 0.1 K/uL   Immature Granulocytes 0 %   Abs Immature Granulocytes 0.03 0.00 - 0.07 K/uL    Comment: Performed at [facility], [address] [place], [place] [address]  Lactic acid, plasma     Status: Abnormal   Collection Time: 03/23/20  5:00 PM  Result Value Ref Range   Lactic Acid, Venous 2.6 (HH) 0.5 - 1.9 mmol/L    Comment: CRITICAL RESULT CALLED TO, READ BACK BY AND VERIFIED WITH: B.BOWERS RN [phone]  03/23/20 MCCORMICK K Performed at [facility], [address], [place], [place] [address]   Sample to Blood Bank     Status: None   Collection Time: 03/23/20  5:00 PM  Result Value Ref Range   Blood Bank Specimen SAMPLE AVAILABLE FOR TESTING    Sample Expiration      03/24/2020,2359 Performed at [facility], [address] [place], [place] [address]   Protime-INR     Status: None   Collection Time: 03/23/20  6:30 PM  Result Value Ref Range   Prothrombin Time 13.6 11.4 - 15.2 seconds   INR 1.1 0.8 - 1.2    Comment: (NOTE) INR goal varies based on device and disease states. Performed at [facility], [address] [place], [place] [address]   APTT     Status: None   Collection Time: 03/23/20  6:30 PM  Result Value Ref Range   aPTT 27 24 - 36 seconds    Comment: Performed at [facility], [address] [place], [place] [address]  Lactic acid, plasma     Status: Abnormal   Collection Time: 03/23/20  6:37 PM  Result Value Ref Range   Lactic Acid, Venous 2.3 (HH) 0.5 - 1.9 mmol/L    Comment: CRITICAL VALUE NOTED.  VALUE IS CONSISTENT WITH PREVIOUSLY REPORTED AND CALLED VALUE. Performed at [facility], [address], [place], [place] [address]     DG Chest Port 1 View  Result Date: 03/23/2020 CLINICAL DATA:  Sepsis. EXAM: PORTABLE CHEST 1 VIEW COMPARISON:  03/23/2020 FINDINGS: The cardiac silhouette, mediastinal and hilar contours  are normal. The lungs are clear. No pleural effusion. The bony thorax is intact. IMPRESSION: No acute cardiopulmonary findings. Electronically Signed   By: Rudie Meyer M.D.   On: 03/23/2020 18:32    Review of Systems  Constitutional: Negative for fever.  Gastrointestinal: Positive for abdominal pain. Negative for constipation, nausea and vomiting.  Genitourinary: Negative for dysuria.  All other systems reviewed and are negative.  Blood pressure 135/84, pulse 96, temperature 98.2 F (36.8 C), temperature source Oral, resp. rate 16, SpO2 97 %. Physical Exam Constitutional:      Appearance: Normal appearance.  HENT:     Head: Normocephalic and atraumatic.     Nose: Nose normal.  Eyes:     Extraocular Movements: Extraocular movements intact.     Pupils: Pupils are equal, round, and reactive to light.  Cardiovascular:     Rate and Rhythm: Normal rate and regular rhythm.     Pulses: Normal pulses.  Pulmonary:     Effort: Pulmonary effort is normal.     Breath sounds: Normal breath sounds.  Abdominal:     General: Abdomen is flat.     Palpations: There is no mass.     Tenderness: There is abdominal tenderness (very mildlly tender luq). There is no rebound.     Hernia: No hernia is present.  Musculoskeletal:        General: No tenderness.     Cervical back: Normal range of motion.     Right lower leg: No edema.     Left lower leg: No edema.  Lymphadenopathy:     Cervical: No cervical adenopathy.  Skin:    General: Skin is warm and dry.     Capillary Refill: Capillary refill takes less than 2 seconds.  Neurological:     General: No focal deficit present.  Mental Status: He is alert.  Psychiatric:        Mood and Affect: Mood normal.        Behavior: Behavior normal.     Assessment/Plan: Possible small bowel pneumatosis, enteritis -no indication for surgery on exam, ct  -admission, abx, npo except ice chips and meds -likely will resolve on own -will need GI follow  up as outpatient as he has multiple episodes of pain attributed to diverticulitis and this may not be case.   Emelia LoronMatthew Amour Cutrone 03/23/2020, 7:06 PM

## 2020-03-23 NOTE — ED Triage Notes (Signed)
Pt send from GI doctor for abnormal ct scan result, pt has ct scan disc with him, denies any abd pain at this time no nausea or vomiting.

## 2020-03-23 NOTE — H&P (Signed)
History and Physical    Quinterius Gaida RSW:[id number] DOB: [birth year] DOA: 03/23/2020  PCP: Patient, No Pcp Per Patient coming from: Home  Chief Complaint: Abnormal CT  HPI: Adam Zimmerman is a 39 y.o. male with no significant past medical history being sent to the ED by his gastroenterologist after he had a CT abdomen pelvis done today for evaluation of abdominal pain which revealed small areas of gas along the jejunal wall concerning for early pneumatosis.  Patient reports 1 week history of left upper quadrant abdominal discomfort.  States he has had diverticulitis in the past and thought that might be causing his discomfort.  He went to see his gastroenterologist and was told to come into the emergency room.  He has not had any nausea or vomiting.  No diarrhea.  No other complaints.  Patient denies fevers, chills, cough, or shortness of breath.  ED Course: Afebrile.  Slightly tachycardic.  Not hypotensive.  WBC 13.4, hemoglobin 16.8, hematocrit 49.6, platelet 331k.  Sodium 137, potassium 3.6, chloride 101, bicarb 22, BUN 10, creatinine 1.0, glucose 103.  Transaminases mildly elevated (AST 45, ALT 92).  Alk phos and T bili normal.  Lactic acid 2.6 >2.3.  INR 1.1.  SARS-CoV-2 PCR test negative.  Influenza panel negative.  UA and urine culture pending.  Blood culture x2 pending.  Chest x-ray showing no acute cardiopulmonary findings. Patient received ceftriaxone, metronidazole, and 1 L LR bolus.  General surgery consulted.  Review of Systems:  All systems reviewed and apart from history of presenting illness, are negative.  History reviewed. No pertinent past medical history.  History reviewed. No pertinent surgical history.   reports that he has never smoked. He has never used smokeless tobacco. He reports current alcohol use. He reports that he does not use drugs.  Allergies  Allergen Reactions  . Amoxicillin Hives    Family History  Problem Relation Age of Onset  . Healthy Mother   .  Healthy Father     Prior to Admission medications   Medication Sig Start Date End Date Taking? Authorizing Provider  benzonatate (TESSALON) 100 MG capsule Take 1 capsule (100 mg total) by mouth every 8 (eight) hours. 06/21/19   Avegno, Darrelyn Hillock, FNP  ciprofloxacin-dexamethasone (CIPRODEX) OTIC suspension Place 4 drops into the left ear 2 (two) times daily. 01/30/20   Avegno, Darrelyn Hillock, FNP  ibuprofen (ADVIL,MOTRIN) 800 MG tablet Take 1 tablet (800 mg total) by mouth every 8 (eight) hours as needed. 04/09/18   Milton Ferguson, MD  UNKNOWN TO PATIENT Take 1 tablet by mouth 2 (two) times daily. 10 day course completed on 04/08/2018-Antibiotic--Name is unknown    [provider]    Physical Exam: Vitals:   03/23/20 1346 03/23/20 1624 03/23/20 1900  BP: (!) 152/84 135/84 (!) 135/91  Pulse: (!) 117 96 99  Resp: 20 16 (!) 24  Temp: 98.2 F (36.8 C)    TempSrc: Oral    SpO2: 98% 97% 97%    Physical Exam Constitutional:      General: He is not in acute distress. HENT:     Head: Normocephalic and atraumatic.  Eyes:     Extraocular Movements: Extraocular movements intact.     Conjunctiva/sclera: Conjunctivae normal.  Cardiovascular:     Rate and Rhythm: Normal rate and regular rhythm.     Pulses: Normal pulses.  Pulmonary:     Effort: Pulmonary effort is normal. No respiratory distress.     Breath sounds: Normal breath sounds. No wheezing or  rales.  Abdominal:     General: Bowel sounds are normal. There is no distension.     Palpations: Abdomen is soft.     Tenderness: There is no abdominal tenderness.  Musculoskeletal:        General: No swelling or tenderness.     Cervical back: Normal range of motion and neck supple.  Skin:    General: Skin is warm and dry.  Neurological:     General: No focal deficit present.     Mental Status: He is alert and oriented to person, place, and time.     Labs on Admission: I have personally reviewed following labs and imaging  studies  CBC: Recent Labs  Lab 03/23/20 1700  WBC 13.4*  NEUTROABS 10.1*  HGB 16.8  HCT 49.6  MCV 86.4  PLT 381   Basic Metabolic Panel: Recent Labs  Lab 03/23/20 1700  NA 137  K 3.6  CL 101  CO2 22  GLUCOSE 103*  BUN 10  CREATININE 1.08  CALCIUM 9.9   GFR: CrCl cannot be calculated (Unknown ideal weight.). Liver Function Tests: Recent Labs  Lab 03/23/20 1700  AST 45*  ALT 92*  ALKPHOS 49  BILITOT 0.9  PROT 8.5*  ALBUMIN 5.0   Recent Labs  Lab 03/23/20 1700  LIPASE 24   No results for input(s): AMMONIA in the last 168 hours. Coagulation Profile: Recent Labs  Lab 03/23/20 1830  INR 1.1   Cardiac Enzymes: No results for input(s): CKTOTAL, CKMB, CKMBINDEX, TROPONINI in the last 168 hours. BNP (last 3 results) No results for input(s): PROBNP in the last 8760 hours. HbA1C: No results for input(s): HGBA1C in the last 72 hours. CBG: No results for input(s): GLUCAP in the last 168 hours. Lipid Profile: No results for input(s): CHOL, HDL, LDLCALC, TRIG, CHOLHDL, LDLDIRECT in the last 72 hours. Thyroid Function Tests: No results for input(s): TSH, T4TOTAL, FREET4, T3FREE, THYROIDAB in the last 72 hours. Anemia Panel: No results for input(s): VITAMINB12, FOLATE, FERRITIN, TIBC, IRON, RETICCTPCT in the last 72 hours. Urine analysis:    Component Value Date/Time   COLORURINE YELLOW 04/09/2018 2030   APPEARANCEUR CLEAR 04/09/2018 2030   LABSPEC 1.015 04/09/2018 2030   PHURINE 6.0 04/09/2018 2030   GLUCOSEU NEGATIVE 04/09/2018 2030   [place] NEGATIVE 04/09/2018 2030   [place] 04/09/2018 2030   [place] 04/09/2018 2030   PROTEINUR NEGATIVE 04/09/2018 2030   NITRITE NEGATIVE 04/09/2018 2030   LEUKOCYTESUR NEGATIVE 04/09/2018 2030    Radiological Exams on Admission: DG Chest Port 1 View  Result Date: 03/23/2020 CLINICAL DATA:  Sepsis. EXAM: PORTABLE CHEST 1 VIEW COMPARISON:  03/23/2020 FINDINGS: The cardiac silhouette,  mediastinal and hilar contours are normal. The lungs are clear. No pleural effusion. The bony thorax is intact. IMPRESSION: No acute cardiopulmonary findings. Electronically Signed   By: Marijo Sanes M.D.   On: 03/23/2020 18:32   CT abdomen pelvis done at [facility] healthcare:  "CLINICAL DATA: LEFT mid to LEFT lower quadrant pain over 1 week.  EXAM: CT ABDOMEN AND PELVIS WITH CONTRAST  TECHNIQUE: Multidetector CT imaging of the abdomen and pelvis was performed using the standard protocol following bolus administration of intravenous contrast.  CONTRAST: 80 mL Isovue 370  COMPARISON: April 09, 2018  FINDINGS: Lower chest: Incidental imaging of the lung bases without effusion or consolidation.  Hepatobiliary: Marked hepatic steatosis. Signs of fatty sparing about the porta hepatis with similar appearance to prior imaging. Portal vein is patent. Hepatic veins are patent.  No focal, suspicious hepatic lesion on venous phase assessment. No pericholecystic stranding. No biliary duct dilation.  Pancreas: Normal  Spleen: Normal  Adrenals/Urinary Tract: Normal appearance of the adrenal glands. No hydronephrosis. Symmetric renal enhancement. Normal appearance of urinary bladder.  Stomach/Bowel: Stomach is unremarkable.  Mild small bowel thickening of jejunal loops versus under distension. No perienteric stranding. Small areas of gas along the wall of the jejunum, some areas are anti dependent and clearly outlined valvulae, then taped is, other areas, for instance on image 57 of series 2 and image 45 of series 2 in near dependent portions of the small bowel. The remaining small bowel is normal. There is no sign of obstruction. The appendix is normal. Colon is of normal caliber without signs of pericolonic stranding or evidence of significant diverticular disease.  Vascular/Lymphatic: Patent abdominal vasculature. Normal caliber abdominal aorta. There is no gastrohepatic or  hepatoduodenal ligament lymphadenopathy. No retroperitoneal or mesenteric lymphadenopathy.  No pelvic sidewall lymphadenopathy.  Reproductive: Prostate unremarkable by CT.  Other: No free air. No ascites.  Musculoskeletal: No acute bone finding. No destructive bone process. Pars defects at L5.  IMPRESSION: 1. Small areas of gas along the wall of the jejunum, some areas are anti dependent and clearly outlined valvulae, other areas are dependent and somewhat atypical for gas trapped in valvulae raising the question of subtle, early pneumatosis. Some wall thickening may be present but there is no evidence of mesenteric stranding, fluid or portal venous gas. Vascular structures appear patent. Would correlate with any risk factors for enteritis and with lactate to exclude the possibility of early ischemia. 2. Marked hepatic steatosis. Signs of fatty sparing about the porta hepatis with similar appearance to prior imaging. 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  These results were called by telephone at the time of interpretation on 03/23/2020 at 11:56 am to provider Bernardo Heater , who verbally acknowledged these results.   Electronically Signed By: Zetta Bills M.D. On: 03/23/2020 11:58"   EKG: Independently reviewed.  Sinus rhythm.  Assessment/Plan Active Problems:   Pneumatosis of intestines   Possible small bowel pneumatosis, enteritis Sepsis Patient was sent to the ED by his gastroenterologist after he had a CT abdomen pelvis done today for evaluation of abdominal pain which revealed small areas of gas along the jejunal wall concerning for early pneumatosis.  Meets sepsis criteria -2 SIRS (tachycardia, mild leukocytosis) and has a possible source of infection.   Lactic acid slightly elevated 2.6 >2.3.  Patient has been seen by general surgery and it is felt that there is no indication for surgery at this time.  It is felt that this will likely resolve on its own.  Recommending admission  for antibiotics and keeping patient n.p.o. except ice chips and meds.  Recommending outpatient GI follow-up. -Fluid boluses per sepsis protocol.  Continue ceftriaxone and metronidazole.  Keep n.p.o. except ice chips and meds, continue IV fluid hydration.  Blood culture x2 pending.  Continue to monitor WBC count.  Trend lactate.  Mildly elevated transaminases Transaminases mildly elevated (AST 45, ALT 92).  Alk phos and T bili normal.  CT showing marked hepatic steatosis.  Patient denies regular or heavy alcohol use.  Does have obesity with BMI 32.9. -Encourage weight loss/lifestyle modification.  Will order right upper quadrant ultrasound for further evaluation.  HIV screening -The patient falls between the ages of 13-64 and should be screened for HIV, therefore HIV testing ordered.  DVT prophylaxis: SCDs at this time Code Status: Full code Family Communication: No  family available at this time. Disposition Plan: Status is: Inpatient  Remains inpatient appropriate because:IV treatments appropriate due to intensity of illness or inability to take PO and Inpatient level of care appropriate due to severity of illness   Dispo: The patient is from: Home              Anticipated d/c is to: Home              Anticipated d/c date is: 2 days              Patient currently is not medically stable to d/c.  The medical decision making on this patient was of high complexity and the patient is at high risk for clinical deterioration, therefore this is a level 3 visit.  Shela Leff MD [facility]  If 7PM-7AM, please contact night-coverage [email or web address]  03/23/2020, 7:31 PM

## 2020-03-23 NOTE — ED Notes (Signed)
Report attempted.   -- RN unavailable at this time.

## 2020-03-24 ENCOUNTER — Inpatient Hospital Stay (HOSPITAL_COMMUNITY): Payer: Managed Care, Other (non HMO)

## 2020-03-24 LAB — COMPREHENSIVE METABOLIC PANEL
ALT: 64 U/L — ABNORMAL HIGH (ref 0–44)
AST: 30 U/L (ref 15–41)
Albumin: 3.6 g/dL (ref 3.5–5.0)
Alkaline Phosphatase: 34 U/L — ABNORMAL LOW (ref 38–126)
Anion gap: 7 (ref 5–15)
BUN: 9 mg/dL (ref 6–20)
CO2: 23 mmol/L (ref 22–32)
Calcium: 8.8 mg/dL — ABNORMAL LOW (ref 8.9–10.3)
Chloride: 109 mmol/L (ref 98–111)
Creatinine, Ser: 1.01 mg/dL (ref 0.61–1.24)
GFR, Estimated: >60 mL/min (ref >60)
Glucose, Bld: 100 mg/dL — ABNORMAL HIGH (ref 70–99)
Potassium: 3.9 mmol/L (ref 3.5–5.1)
Sodium: 139 mmol/L (ref 135–145)
Total Bilirubin: 1.1 mg/dL (ref 0.3–1.2)
Total Protein: 6.2 g/dL — ABNORMAL LOW (ref 6.5–8.1)

## 2020-03-24 MED ORDER — MORPHINE SULFATE (PF) 2 MG/ML IV SOLN: 1.0000 mg | INTRAVENOUS | Status: DC | PRN

## 2020-03-24 MED ORDER — ONDANSETRON HCL 4 MG/2ML IJ SOLN: 4.0000 mg | Freq: Four times a day (QID) | INTRAMUSCULAR | Status: DC | PRN

## 2020-03-24 MED ORDER — KCL IN DEXTROSE-NACL 20-5-0.45 MEQ/L-%-% IV SOLN
INTRAVENOUS | Status: DC
  Filled 2020-03-24 (×2): qty 1000

## 2020-03-24 NOTE — Progress Notes (Addendum)
PROGRESS NOTE    Adam Zimmerman  NUU:[id number] DOB: 03/18/1981 DOA: 03/23/2020 PCP: Patient, No Pcp Per    Brief Narrative:  Adam Zimmerman is a 39 year old male with no significant past medical history who presented to [facility], ED with complaint of abdominal pain.  Was sent in by his gastroenterologist after CT abdomen/pelvis performed with findings for early pneumatosis along the jejunal wall.  Patient reports abdominal discomfort x1 week.  Patient reports previous history of diverticulitis, and thought this was the likely cause.  Denies any nausea or vomiting, no diarrhea.  No fever.  In the ED, afebrile with mild tachycardia.  WBC count 13.4, hemoglobin 16.8, platelet 331, sodium 137, potassium 3.6, chloride 101, bicarb 22, BUN 10, creatinine 1.0, glucose 103.  AST 45, ALT 92.  Total bilirubin within normal limits.  Lactic acid 2.6.  INR 1.1.  SARS-CoV-2 to PCR negative.  Chest x-ray with no acute cardiopulmonary disease findings.  Patient was started on ceftriaxone, metronidazole and given 1 L LR bolus.  General surgery was consulted.  Hospitalist service consulted for admission.   Assessment & Plan:   Principal Problem:   Pneumatosis of intestines Active Problems:   Enteritis   Elevated transaminase level   Encounter for screening for HIV  Possible small bowel pneumatosis, Enteritis Sepsis, present on admission Patient presenting from GI office following complaint of abdominal pain with CT findings concerning for early pneumatosis along the jejunal wall.  Patient is afebrile.  Patient was tachycardic with mild leukocytosis with elevated lactic acid of 2.6. received 3L IVF bolus on admission --General surgery following, recommend nonoperative approach --WBC 13.4>9.5 --Blood cultures x2: Pending --Ceftriaxone 2 g IV every 24 hours, metronidazole 500 mg IV every 8 hours --N.p.o. --Continue IVF with D5 1/2 NS at 50mL/hr --Morphine 1 mg q3h prn pain --zofran 4mg  IV q6h prn  nausea  Elevated LFTs AST 45, ALT 92.  Alkaline phosphatase and total bilirubin within normal limits.  CT abdomen/pelvis showing marked hepatic steatosis.  Patient denies regular heavy alcohol use, does have obesity with a BMI of 32.9. --Continue to monitor LFTs daily --Right upper quadrant ultrasound pending --Discussed weight loss/lifestyle modification   DVT prophylaxis: ambulate Code Status: Full Code Family Communication: No family present at bedside this morning  Disposition Plan:  Status is: Inpatient  Remains inpatient appropriate because:Ongoing diagnostic testing needed not appropriate for outpatient work up, IV treatments appropriate due to intensity of illness or inability to take PO and Inpatient level of care appropriate due to severity of illness   Dispo: The patient is from: Home              Anticipated d/c is to: Home              Anticipated d/c date is: 2 days              Patient currently is not medically stable to d/c.   Consultants:   General surgery  Procedures:   None  Antimicrobials:   Ceftriaxone 10/29>>  Metronidazole 10/29>>   Subjective: Patient seen and examined bedside, resting comfortably.  Continues with some mild left lower quadrant pain, improved since initial presentation.  Reports bowel movement overnight.  Request when he can start having something to eat.  No other complaints or concerns at this time.  Denies headache, no fever/chills/night sweats, no nausea/vomiting/diarrhea, no shortness of breath, no chest pain, no palpitation, no weakness, no fatigue.  No acute events overnight per nursing staff.  Objective: Vitals:  03/23/20 2341 03/24/20 0433 03/24/20 0700 03/24/20 0808  BP: 125/76 123/69  111/67  Pulse: 72 64  62  Resp: 18 16  20   Temp: 98.2 F (36.8 C) 97.9 F (36.6 C)  98.1 F (36.7 C)  TempSrc: Oral Oral  Oral  SpO2: 98% 96%  98%  Weight:   97.5 kg   Height:   5\' 5"  (1.651 m)     Intake/Output Summary (Last  24 hours) at 03/24/2020 1222 Last data filed at 03/23/2020 2012 Gross per 24 hour  Intake 1200 ml  Output --  Net 1200 ml   Filed Weights   03/24/20 0700  Weight: 97.5 kg    Examination:  General exam: Appears calm and comfortable  Respiratory system: Clear to auscultation. Respiratory effort normal. Cardiovascular system: S1 & S2 heard, RRR. No JVD, murmurs, rubs, gallops or clicks. No pedal edema. Gastrointestinal system: Abdomen is nondistended, soft, with mild LLQ tenderness. No organomegaly or masses felt. Normal bowel sounds heard. Central nervous system: Alert and oriented. No focal neurological deficits. Extremities: Symmetric 5 x 5 power. Skin: No rashes, lesions or ulcers Psychiatry: Judgement and insight appear normal. Mood & affect appropriate.     Data Reviewed: I have personally reviewed following labs and imaging studies  CBC: Recent Labs  Lab 03/23/20 1700 03/23/20 2326  WBC 13.4* 9.5  NEUTROABS 10.1*  --   HGB 16.8 13.2  HCT 49.6 38.6*  MCV 86.4 85.8  PLT 331 260   Basic Metabolic Panel: Recent Labs  Lab 03/23/20 1700 03/24/20 0354  NA 137 139  K 3.6 3.9  CL 101 109  CO2 22 23  GLUCOSE 103* 100*  BUN 10 9  CREATININE 1.08 1.01  CALCIUM 9.9 8.8*   GFR: Estimated Creatinine Clearance: 107.5 mL/min (by C-G formula based on SCr of 1.01 mg/dL). Liver Function Tests: Recent Labs  Lab 03/23/20 1700 03/24/20 0354  AST 45* 30  ALT 92* 64*  ALKPHOS 49 34*  BILITOT 0.9 1.1  PROT 8.5* 6.2*  ALBUMIN 5.0 3.6   Recent Labs  Lab 03/23/20 1700  LIPASE 24   No results for input(s): AMMONIA in the last 168 hours. Coagulation Profile: Recent Labs  Lab 03/23/20 1830  INR 1.1   Cardiac Enzymes: No results for input(s): CKTOTAL, CKMB, CKMBINDEX, TROPONINI in the last 168 hours. BNP (last 3 results) No results for input(s): PROBNP in the last 8760 hours. HbA1C: No results for input(s): HGBA1C in the last 72 hours. CBG: No results for  input(s): GLUCAP in the last 168 hours. Lipid Profile: No results for input(s): CHOL, HDL, LDLCALC, TRIG, CHOLHDL, LDLDIRECT in the last 72 hours. Thyroid Function Tests: No results for input(s): TSH, T4TOTAL, FREET4, T3FREE, THYROIDAB in the last 72 hours. Anemia Panel: No results for input(s): VITAMINB12, FOLATE, FERRITIN, TIBC, IRON, RETICCTPCT in the last 72 hours. Sepsis Labs: Recent Labs  Lab 03/23/20 1700 03/23/20 1837 03/23/20 2138  LATICACIDVEN 2.6* 2.3* 1.4    Recent Results (from the past 240 hour(s))  Respiratory Panel by RT PCR (Flu A&B, Covid) - Nasopharyngeal Swab     Status: None   Collection Time: 03/23/20  4:37 PM   Specimen: Nasopharyngeal Swab  Result Value Ref Range Status   SARS Coronavirus 2 by RT PCR NEGATIVE NEGATIVE Final    Comment: (NOTE) SARS-CoV-2 target nucleic acids are NOT DETECTED.  The SARS-CoV-2 RNA is generally detectable in upper respiratoy specimens during the acute phase of infection. The lowest concentration of SARS-CoV-2 viral copies  this assay can detect is 131 copies/mL. A negative result does not preclude SARS-Cov-2 infection and should not be used as the sole basis for treatment or other patient management decisions. A negative result may occur with  improper specimen collection/handling, submission of specimen other than nasopharyngeal swab, presence of viral mutation(s) within the areas targeted by this assay, and inadequate number of viral copies (<131 copies/mL). A negative result must be combined with clinical observations, patient history, and epidemiological information. The expected result is Negative.  Fact Sheet for Patients:  https://www.moore.com/  Fact Sheet for Healthcare Providers:  https://www.young.biz/  This test is no t yet approved or cleared by the [place] FDA and  has been authorized for detection and/or diagnosis of SARS-CoV-2 by FDA under an Emergency Use  Authorization (EUA). This EUA will remain  in effect (meaning this test can be used) for the duration of the COVID-19 declaration under Section 564(b)(1) of the Act, 21 U.S.C. section 360bbb-3(b)(1), unless the authorization is terminated or revoked sooner.     Influenza A by PCR NEGATIVE NEGATIVE Final   Influenza B by PCR NEGATIVE NEGATIVE Final    Comment: (NOTE) The Xpert Xpress SARS-CoV-2/FLU/RSV assay is intended as an aid in  the diagnosis of influenza from Nasopharyngeal swab specimens and  should not be used as a sole basis for treatment. Nasal washings and  aspirates are unacceptable for Xpert Xpress SARS-CoV-2/FLU/RSV  testing.  Fact Sheet for Patients: https://www.moore.com/  Fact Sheet for Healthcare Providers: https://www.young.biz/  This test is not yet approved or cleared by the [place] FDA and  has been authorized for detection and/or diagnosis of SARS-CoV-2 by  FDA under an Emergency Use Authorization (EUA). This EUA will remain  in effect (meaning this test can be used) for the duration of the  Covid-19 declaration under Section 564(b)(1) of the Act, 21  U.S.C. section 360bbb-3(b)(1), unless the authorization is  terminated or revoked. Performed at [facility], [address], [place], [place] [address]          Radiology Studies: DG Chest Port 1 View  Result Date: 03/23/2020 CLINICAL DATA:  Sepsis. EXAM: PORTABLE CHEST 1 VIEW COMPARISON:  03/23/2020 FINDINGS: The cardiac silhouette, mediastinal and hilar contours are normal. The lungs are clear. No pleural effusion. The bony thorax is intact. IMPRESSION: No acute cardiopulmonary findings. Electronically Signed   By: Rudie Meyer M.D.   On: 03/23/2020 18:32        Scheduled Meds: Continuous Infusions: . cefTRIAXone (ROCEPHIN)  IV Stopped (03/23/20 1925)  . dextrose 5 % and 0.45 % NaCl with KCl 20 mEq/L    . metronidazole 500 mg (03/24/20 0604)       LOS: 1 day    Time spent: 36 minutes spent on chart review, discussion with nursing staff, consultants, updating family and interview/physical exam; more than 50% of that time was spent in counseling and/or coordination of care.    Alvira Philips [place], DO [facility] Available via Epic secure chat 7am-7pm After these hours, please refer to coverage provider listed on [email or web address] 03/24/2020, 12:22 PM

## 2020-03-24 NOTE — Progress Notes (Addendum)
[facility] Progress Note     Subjective: CC:   Denies pain at rest. Denies fever, chills, nausea, or vomiting. Reports a loose, non-bloody stool this AM.  Objective: Vital signs in last 24 hours: Temp:  [97.9 F (36.6 C)-98.4 F (36.9 C)] 98.1 F (36.7 C) (10/30 0808) Pulse Rate:  [62-117] 62 (10/30 0808) Resp:  [16-27] 20 (10/30 0808) BP: (111-152)/(67-91) 111/67 (10/30 0808) SpO2:  [96 %-98 %] 98 % (10/30 0808) Weight:  [97.5 kg] 97.5 kg (10/30 0700)    Intake/Output from previous day: 10/29 0701 - 10/30 0700 In: 1200 [IV Piggyback:1200] Out: -  Intake/Output this shift: No intake/output data recorded.  PE: Gen:  Alert, NAD, pleasant Card:  Regular rate and rhythm, pedal pulses 2+ BL Pulm:  Normal effort, clear to auscultation bilaterally Abd: Soft, mild TTP LLQ with some guarding, no peritonitis, +BS, no HSM Skin: warm and dry, no rashes  Psych: A&Ox3   Lab Results:  Recent Labs    03/23/20 1700 03/23/20 2326  WBC 13.4* 9.5  HGB 16.8 13.2  HCT 49.6 38.6*  PLT 331 260   BMET Recent Labs    03/23/20 1700 03/24/20 0354  NA 137 139  K 3.6 3.9  CL 101 109  CO2 22 23  GLUCOSE 103* 100*  BUN 10 9  CREATININE 1.08 1.01  CALCIUM 9.9 8.8*   PT/INR Recent Labs    03/23/20 1830  LABPROT 13.6  INR 1.1   CMP     Component Value Date/Time   NA 139 03/24/2020 0354   K 3.9 03/24/2020 0354   CL 109 03/24/2020 0354   CO2 23 03/24/2020 0354   GLUCOSE 100 (H) 03/24/2020 0354   BUN 9 03/24/2020 0354   CREATININE 1.01 03/24/2020 0354   CALCIUM 8.8 (L) 03/24/2020 0354   PROT 6.2 (L) 03/24/2020 0354   ALBUMIN 3.6 03/24/2020 0354   AST 30 03/24/2020 0354   ALT 64 (H) 03/24/2020 0354   ALKPHOS 34 (L) 03/24/2020 0354   BILITOT 1.1 03/24/2020 0354   GFRNONAA >60 03/24/2020 0354   GFRAA >60 04/09/2018 2032   Lipase     Component Value Date/Time   LIPASE 24 03/23/2020 1700       Studies/Results: DG Chest Port 1 View  Result Date:  03/23/2020 CLINICAL DATA:  Sepsis. EXAM: PORTABLE CHEST 1 VIEW COMPARISON:  03/23/2020 FINDINGS: The cardiac silhouette, mediastinal and hilar contours are normal. The lungs are clear. No pleural effusion. The bony thorax is intact. IMPRESSION: No acute cardiopulmonary findings. Electronically Signed   By: Rudie Meyer M.D.   On: 03/23/2020 18:32    Anti-infectives: Anti-infectives (From admission, onward)   Start     Dose/Rate Route Frequency Ordered Stop   03/24/20 0500  metroNIDAZOLE (FLAGYL) IVPB 500 mg        500 mg 100 mL/hr over 60 Minutes Intravenous Every 8 hours 03/23/20 2002     03/23/20 1800  metroNIDAZOLE (FLAGYL) IVPB 500 mg        500 mg 100 mL/hr over 60 Minutes Intravenous  Once 03/23/20 1753 03/23/20 2012   03/23/20 1800  cefTRIAXone (ROCEPHIN) 2 g in sodium chloride 0.9 % 100 mL IVPB        2 g 200 mL/hr over 30 Minutes Intravenous Every 24 hours 03/23/20 1756       Assessment/Plan  possible small bowel pneumatosis, enteritis  - afebrile, vitals stable, lactate 1.4  - continue IV abx  - mild LLQ tenderness on exam.  overall patient seems to be improving. Would continue bowel rest for today and advance diet tomorrow if continues to improve. Will start IVF at 75 cc/hr. - PRN analgesics and antiemetics - CBC in AM   - No acute surgical needs. Will need GI follow up as outpatient as he has multiple episodes of pain attributed to diverticulitis and this may not be case.    LOS: 1 day    Hosie Spangle, [facility] Please see Amion for pager number during day hours 7:00am-4:30pm  Agree with above. Will keep NPO for one more day. He needs to get out of bed and ambulate.  Ovidio Kin, MD, [facility] Office phone:  [phone]

## 2020-03-24 NOTE — Plan of Care (Signed)
  Problem: Education: Goal: Knowledge of General Education information will improve Description: Including pain rating scale, medication(s)/side effects and non-pharmacologic comfort measures Outcome: Progressing   Problem: Health Behavior/Discharge Planning: Goal: Ability to manage health-related needs will improve Outcome: Progressing   Problem: Clinical Measurements: Goal: Will remain free from infection Outcome: Progressing   

## 2020-03-24 NOTE — Plan of Care (Signed)

## 2020-03-25 LAB — URINE CULTURE: Culture: NO GROWTH

## 2020-03-25 NOTE — Discharge Summary (Signed)
Physician Discharge Summary  Adam NetJuan Bordeau RUE:[id number] DOB: 12/08/1980 DOA: 03/23/2020  PCP: Patient, No Pcp Per  Admit date: 03/23/2020 Discharge date: 03/25/2020  Admitted From: Home Disposition: Home  Recommendations for Outpatient Follow-up:  1. Follow up with PCP in 1-2 weeks 2. Recommend referral to gastroenterology for consideration of colonoscopy 3. Continue previously prescribed ciprofloxacin and metronidazole to complete antibiotic course 4. Recommend CMP at next PCP visit.  Home Health: No Equipment/Devices: None  Discharge Condition: Stable CODE STATUS: Full code Diet recommendation: Heart healthy diet  History of present illness:  Adam Zimmerman is a 39 year old male with no significant past medical history who [facility] to [facility] ED with complaint of abdominal pain.  Was sent in by his gastroenterologist after CT abdomen/pelvis performed with findings for early pneumatosis along the jejunal wall.  Patient reports abdominal discomfort x1 week.  Patient reports previous history of diverticulitis, and thought this was the likely cause.  Denies any nausea or vomiting, no diarrhea.  No fever.  In the ED, afebrile with mild tachycardia.  WBC count 13.4, hemoglobin 16.8, platelet 331, sodium 137, potassium 3.6, chloride 101, bicarb 22, BUN 10, creatinine 1.0, glucose 103. AST 45, ALT 92.  Total bilirubin within normal limits.  Lactic acid 2.6.  INR 1.1.  SARS-CoV-2 to PCR negative.  Chest x-ray with no acute cardiopulmonary disease findings.  Patient was started on ceftriaxone, metronidazole and given 1 L LR bolus.  General surgery was consulted.  Hospitalist service consulted for admission.  Hospital course:  Possible small bowel pneumatosis, Enteritis Sepsis, present on admission Patient presenting from GI office following complaint of abdominal pain with CT findings concerning for early pneumatosis along the jejunal wall.  Patient is afebrile.  Patient was tachycardic  with mild leukocytosis with elevated lactic acid of 2.6. received 3L IVF bolus on admission.  General surgery was consulted and followed during hospital course.  Patient was started on empiric antibiotics with ceftriaxone and metronidazole.  Patient initially kept n.p.o. and diet slowly advanced with toleration.  Leukocytosis resolved.  No growth in blood cultures or hospitalization.  May resume and complete antibiotics previously prescribed with metronidazole and ciprofloxacin to complete antibiotic course.  Recommend outpatient referral to gastroenterology for consideration of colonoscopy in the near future.  Elevated LFTs AST 45, ALT 92.  Alkaline phosphatase and total bilirubin within normal limits.  CT abdomen/pelvis showing marked hepatic steatosis.  Patient denies regular heavy alcohol use, does have obesity with a BMI of 32.9.  Ultrasound abdomen with normal appearance of the gallbladder and diffusely increased heterogeneous parenchymal echogenicity of the liver consistent with fatty infiltration.  Recommend heart healthy diet, avoid alcohol.  Weight loss/lifestyle modification.  Outpatient referral to gastroenterology.  Discharge Diagnoses:  Active Problems:   Elevated transaminase level   Encounter for screening for HIV    Discharge Instructions  Discharge Instructions    Call MD for:  difficulty breathing, headache or visual disturbances   Complete by: As directed    Call MD for:  extreme fatigue   Complete by: As directed    Call MD for:  persistant dizziness or light-headedness   Complete by: As directed    Call MD for:  persistant nausea and vomiting   Complete by: As directed    Call MD for:  severe uncontrolled pain   Complete by: As directed    Call MD for:  temperature >100.4   Complete by: As directed    Diet - low sodium heart healthy   Complete  by: As directed    Increase activity slowly   Complete by: As directed      Allergies as of 03/25/2020      Reactions    Amoxicillin Hives      Medication List    STOP taking these medications   benzonatate 100 MG capsule Commonly known as: TESSALON   ciprofloxacin-dexamethasone OTIC suspension Commonly known as: Ciprodex   ibuprofen 800 MG tablet Commonly known as: ADVIL     TAKE these medications   ciprofloxacin 500 MG tablet Commonly known as: CIPRO Take 500 mg by mouth 2 (two) times daily. FOR 10 DAYS   metroNIDAZOLE 500 MG tablet Commonly known as: FLAGYL Take 500 mg by mouth 2 (two) times daily.       Allergies  Allergen Reactions  . Amoxicillin Hives    Consultations:  General surgery   Procedures/Studies: DG Chest Port 1 View  Result Date: 03/23/2020 CLINICAL DATA:  Sepsis. EXAM: PORTABLE CHEST 1 VIEW COMPARISON:  03/23/2020 FINDINGS: The cardiac silhouette, mediastinal and hilar contours are normal. The lungs are clear. No pleural effusion. The bony thorax is intact. IMPRESSION: No acute cardiopulmonary findings. Electronically Signed   By: Rudie Meyer M.D.   On: 03/23/2020 18:32   US Abdomen Limited RUQ (LIVER/GB)  Result Date: 03/24/2020 CLINICAL DATA:  Elevated liver function tests. EXAM: ULTRASOUND ABDOMEN LIMITED RIGHT UPPER QUADRANT COMPARISON:  CT of the abdomen pelvis March 23, 2020 FINDINGS: Gallbladder: No gallstones or wall thickening visualized. No sonographic Murphy sign noted by sonographer. Common bile duct: Diameter: 4.2 mm Liver: No focal lesion identified. Diffusely increased heterogeneous parenchymal echogenicity. Portal vein is patent on color Doppler imaging with normal direction of blood flow towards the liver. Other: None. IMPRESSION: 1. Normal appearance of the gallbladder. 2. Diffusely increased heterogeneous parenchymal echogenicity of the liver, which may be seen in the setting of fatty infiltration or other hepatocellular disease. Electronically Signed   By: Ted Mcalpine M.D.   On: 03/24/2020 13:28     Subjective: Patient seen and  examined bedside, resting comfortably.  Abdominal pain has completely resolved.  Tolerating diet.  Ready for discharge home.  No other complaints or concerns at this time.  Denies headache, no dizziness, no fever/chills/night sweats, no nausea/vomiting/diarrhea, no chest pain, no palpitations, no shortness of breath, no abdominal pain, no weakness, no fatigue, no paresthesias.  No acute events overnight per nursing staff.  Discharge Exam: Vitals:   03/25/20 1220 03/25/20 1230  BP: 94/79 116/69  Pulse: 80 74  Resp: 18 18  Temp: 98 F (36.7 C) 98.5 F (36.9 C)  SpO2: 97% 100%   Vitals:   03/25/20 0513 03/25/20 0700 03/25/20 1220 03/25/20 1230  BP: 111/72 110/72 94/79 116/69  Pulse: (!) 56 68 80 74  Resp: 16 18 18 18   Temp: 98.1 F (36.7 C) 98 F (36.7 C) 98 F (36.7 C) 98.5 F (36.9 C)  TempSrc: Oral Oral  Oral  SpO2: 98% 98% 97% 100%  Weight:      Height:        General: Pt is alert, awake, not in acute distress Cardiovascular: RRR, S1/S2 +, no rubs, no gallops Respiratory: CTA bilaterally, no wheezing, no rhonchi Abdominal: Soft, NT, ND, bowel sounds + Extremities: no edema, no cyanosis    The results of significant diagnostics from this hospitalization (including imaging, microbiology, ancillary and laboratory) are listed below for reference.     Microbiology: Recent Results (from the past 240 hour(s))  Respiratory Panel by  RT PCR (Flu A&B, Covid) - Nasopharyngeal Swab     Status: None   Collection Time: 03/23/20  4:37 PM   Specimen: Nasopharyngeal Swab  Result Value Ref Range Status   SARS Coronavirus 2 by RT PCR NEGATIVE NEGATIVE Final    Comment: (NOTE) SARS-CoV-2 target nucleic acids are NOT DETECTED.  The SARS-CoV-2 RNA is generally detectable in upper respiratoy specimens during the acute phase of infection. The lowest concentration of SARS-CoV-2 viral copies this assay can detect is 131 copies/mL. A negative result does not preclude SARS-Cov-2 infection  and should not be used as the sole basis for treatment or other patient management decisions. A negative result may occur with  improper specimen collection/handling, submission of specimen other than nasopharyngeal swab, presence of viral mutation(s) within the areas targeted by this assay, and inadequate number of viral copies (<131 copies/mL). A negative result must be combined with clinical observations, patient history, and epidemiological information. The expected result is Negative.  Fact Sheet for Patients:  https://www.moore.com/  Fact Sheet for Healthcare Providers:  https://www.young.biz/  This test is no t yet approved or cleared by the [place] FDA and  has been authorized for detection and/or diagnosis of SARS-CoV-2 by FDA under an Emergency Use Authorization (EUA). This EUA will remain  in effect (meaning this test can be used) for the duration of the COVID-19 declaration under Section 564(b)(1) of the Act, 21 U.S.C. section 360bbb-3(b)(1), unless the authorization is terminated or revoked sooner.     Influenza A by PCR NEGATIVE NEGATIVE Final   Influenza B by PCR NEGATIVE NEGATIVE Final    Comment: (NOTE) The Xpert Xpress SARS-CoV-2/FLU/RSV assay is intended as an aid in  the diagnosis of influenza from Nasopharyngeal swab specimens and  should not be used as a sole basis for treatment. Nasal washings and  aspirates are unacceptable for Xpert Xpress SARS-CoV-2/FLU/RSV  testing.  Fact Sheet for Patients: https://www.moore.com/  Fact Sheet for Healthcare Providers: https://www.young.biz/  This test is not yet approved or cleared by the [place] FDA and  has been authorized for detection and/or diagnosis of SARS-CoV-2 by  FDA under an Emergency Use Authorization (EUA). This EUA will remain  in effect (meaning this test can be used) for the duration of the  Covid-19 declaration  under Section 564(b)(1) of the Act, 21  U.S.C. section 360bbb-3(b)(1), unless the authorization is  terminated or revoked. Performed at [facility], [address], [place], [place] [address]   Blood culture (routine single)     Status: None (Preliminary result)   Collection Time: 03/23/20  6:00 PM   Specimen: BLOOD  Result Value Ref Range Status   Specimen Description BLOOD RIGHT ANTECUBITAL  Final   Special Requests   Final    BOTTLES DRAWN AEROBIC AND ANAEROBIC Blood Culture adequate volume   Culture   Final    NO GROWTH 2 DAYS Performed at [facility], [address], [place], [place] [address]    Report Status PENDING  Incomplete  Urine culture     Status: None   Collection Time: 03/23/20  8:00 PM   Specimen: In/Out Cath Urine  Result Value Ref Range Status   Specimen Description IN/OUT CATH URINE  Final   Special Requests NONE  Final   Culture   Final    NO GROWTH Performed at [facility], [address] [place], [place] [address]    Report Status 03/25/2020 FINAL  Final  Labs: BNP (last 3 results) No results for input(s): BNP in the last 8760 hours. Basic Metabolic Panel: Recent Labs  Lab 03/23/20 1700 03/24/20 0354  NA 137 139  K 3.6 3.9  CL 101 109  CO2 22 23  GLUCOSE 103* 100*  BUN 10 9  CREATININE 1.08 1.01  CALCIUM 9.9 8.8*   Liver Function Tests: Recent Labs  Lab 03/23/20 1700 03/24/20 0354  AST 45* 30  ALT 92* 64*  ALKPHOS 49 34*  BILITOT 0.9 1.1  PROT 8.5* 6.2*  ALBUMIN 5.0 3.6   Recent Labs  Lab 03/23/20 1700  LIPASE 24   No results for input(s): AMMONIA in the last 168 hours. CBC: Recent Labs  Lab 03/23/20 1700 03/23/20 2326  WBC 13.4* 9.5  NEUTROABS 10.1*  --   HGB 16.8 13.2  HCT 49.6 38.6*  MCV 86.4 85.8  PLT 331 260   Cardiac Enzymes: No results for input(s): CKTOTAL, CKMB, CKMBINDEX, TROPONINI in the last 168 hours. BNP: Invalid input(s): POCBNP CBG: No results for input(s): GLUCAP in the  last 168 hours. D-Dimer No results for input(s): DDIMER in the last 72 hours. Hgb A1c No results for input(s): HGBA1C in the last 72 hours. Lipid Profile No results for input(s): CHOL, HDL, LDLCALC, TRIG, CHOLHDL, LDLDIRECT in the last 72 hours. Thyroid function studies No results for input(s): TSH, T4TOTAL, T3FREE, THYROIDAB in the last 72 hours.  Invalid input(s): FREET3 Anemia work up No results for input(s): VITAMINB12, FOLATE, FERRITIN, TIBC, IRON, RETICCTPCT in the last 72 hours. Urinalysis    Component Value Date/Time   COLORURINE YELLOW 03/23/2020 1955   APPEARANCEUR CLEAR 03/23/2020 1955   LABSPEC 1.015 03/23/2020 1955   PHURINE 7.0 03/23/2020 1955   GLUCOSEU NEGATIVE 03/23/2020 1955   HGBUR NEGATIVE 03/23/2020 1955   BILIRUBINUR NEGATIVE 03/23/2020 1955   KETONESUR NEGATIVE 03/23/2020 1955   PROTEINUR NEGATIVE 03/23/2020 1955   NITRITE NEGATIVE 03/23/2020 1955   LEUKOCYTESUR TRACE (A) 03/23/2020 1955   Sepsis Labs Invalid input(s): PROCALCITONIN,  WBC,  LACTICIDVEN Microbiology Recent Results (from the past 240 hour(s))  Respiratory Panel by RT PCR (Flu A&B, Covid) - Nasopharyngeal Swab     Status: None   Collection Time: 03/23/20  4:37 PM   Specimen: Nasopharyngeal Swab  Result Value Ref Range Status   SARS Coronavirus 2 by RT PCR NEGATIVE NEGATIVE Final    Comment: (NOTE) SARS-CoV-2 target nucleic acids are NOT DETECTED.  The SARS-CoV-2 RNA is generally detectable in upper respiratoy specimens during the acute phase of infection. The lowest concentration of SARS-CoV-2 viral copies this assay can detect is 131 copies/mL. A negative result does not preclude SARS-Cov-2 infection and should not be used as the sole basis for treatment or other patient management decisions. A negative result may occur with  improper specimen collection/handling, submission of specimen other than nasopharyngeal swab, presence of viral mutation(s) within the areas targeted by this  assay, and inadequate number of viral copies (<131 copies/mL). A negative result must be combined with clinical observations, patient history, and epidemiological information. The expected result is Negative.  Fact Sheet for Patients:  https://www.moore.com/  Fact Sheet for Healthcare Providers:  https://www.young.biz/  This test is no t yet approved or cleared by the [place] FDA and  has been authorized for detection and/or diagnosis of SARS-CoV-2 by FDA under an Emergency Use Authorization (EUA). This EUA will remain  in effect (meaning this test can be used) for the duration of the COVID-19 declaration under Section 564(b)(1)  of the Act, 21 U.S.C. section 360bbb-3(b)(1), unless the authorization is terminated or revoked sooner.     Influenza A by PCR NEGATIVE NEGATIVE Final   Influenza B by PCR NEGATIVE NEGATIVE Final    Comment: (NOTE) The Xpert Xpress SARS-CoV-2/FLU/RSV assay is intended as an aid in  the diagnosis of influenza from Nasopharyngeal swab specimens and  should not be used as a sole basis for treatment. Nasal washings and  aspirates are unacceptable for Xpert Xpress SARS-CoV-2/FLU/RSV  testing.  Fact Sheet for Patients: https://www.moore.com/  Fact Sheet for Healthcare Providers: https://www.young.biz/  This test is not yet approved or cleared by the [place] FDA and  has been authorized for detection and/or diagnosis of SARS-CoV-2 by  FDA under an Emergency Use Authorization (EUA). This EUA will remain  in effect (meaning this test can be used) for the duration of the  Covid-19 declaration under Section 564(b)(1) of the Act, 21  U.S.C. section 360bbb-3(b)(1), unless the authorization is  terminated or revoked. Performed at [facility], [address] [place], [place] [address]   Blood culture (routine single)     Status: None (Preliminary result)   Collection  Time: 03/23/20  6:00 PM   Specimen: BLOOD  Result Value Ref Range Status   Specimen Description BLOOD RIGHT ANTECUBITAL  Final   Special Requests   Final    BOTTLES DRAWN AEROBIC AND ANAEROBIC Blood Culture adequate volume   Culture   Final    NO GROWTH 2 DAYS Performed at [facility], [address], [place], [place] [address]    Report Status PENDING  Incomplete  Urine culture     Status: None   Collection Time: 03/23/20  8:00 PM   Specimen: In/Out Cath Urine  Result Value Ref Range Status   Specimen Description IN/OUT CATH URINE  Final   Special Requests NONE  Final   Culture   Final    NO GROWTH Performed at [facility], [address] [place], [place] [address]    Report Status 03/25/2020 FINAL  Final     Time coordinating discharge: Over 30 minutes  SIGNED:   Alvira Philips [place], DO  [facility] 03/25/2020, 4:10 PM

## 2020-03-25 NOTE — Progress Notes (Signed)
[facility] Progress Note     Subjective: CC:   No abdominal pain.  Hungry.  Passing flatus.  Objective: Vital signs in last 24 hours: Temp:  [98 F (36.7 C)-98.1 F (36.7 C)] 98 F (36.7 C) (10/31 0700) Pulse Rate:  [56-83] 68 (10/31 0700) Resp:  [16-18] 18 (10/31 0700) BP: (110-121)/(70-80) 110/72 (10/31 0700) SpO2:  [97 %-99 %] 98 % (10/31 0700) Last BM Date: 03/24/20 (per pt x2)  Intake/Output from previous day: 10/30 0701 - 10/31 0700 In: 326.7 [I.V.:126.7; IV Piggyback:200] Out: 2 [Urine:2] Intake/Output this shift: No intake/output data recorded.  PE: Gen:  Alert, NAD, pleasant Card:  Regular rate and rhythm, pedal pulses 2+ BL Pulm:  Normal effort, clear to auscultation bilaterally Abd: Soft, benign abdomen with normal BS Skin: warm and dry, no rashes  Psych: A&Ox3   Lab Results:  Recent Labs    03/23/20 1700 03/23/20 2326  WBC 13.4* 9.5  HGB 16.8 13.2  HCT 49.6 38.6*  PLT 331 260   BMET Recent Labs    03/23/20 1700 03/24/20 0354  NA 137 139  K 3.6 3.9  CL 101 109  CO2 22 23  GLUCOSE 103* 100*  BUN 10 9  CREATININE 1.08 1.01  CALCIUM 9.9 8.8*   PT/INR Recent Labs    03/23/20 1830  LABPROT 13.6  INR 1.1   CMP     Component Value Date/Time   NA 139 03/24/2020 0354   K 3.9 03/24/2020 0354   CL 109 03/24/2020 0354   CO2 23 03/24/2020 0354   GLUCOSE 100 (H) 03/24/2020 0354   BUN 9 03/24/2020 0354   CREATININE 1.01 03/24/2020 0354   CALCIUM 8.8 (L) 03/24/2020 0354   PROT 6.2 (L) 03/24/2020 0354   ALBUMIN 3.6 03/24/2020 0354   AST 30 03/24/2020 0354   ALT 64 (H) 03/24/2020 0354   ALKPHOS 34 (L) 03/24/2020 0354   BILITOT 1.1 03/24/2020 0354   GFRNONAA >60 03/24/2020 0354   GFRAA >60 04/09/2018 2032   Lipase     Component Value Date/Time   LIPASE 24 03/23/2020 1700       Studies/Results: DG Chest Port 1 View  Result Date: 03/23/2020 CLINICAL DATA:  Sepsis. EXAM: PORTABLE CHEST 1 VIEW COMPARISON:   03/23/2020 FINDINGS: The cardiac silhouette, mediastinal and hilar contours are normal. The lungs are clear. No pleural effusion. The bony thorax is intact. IMPRESSION: No acute cardiopulmonary findings. Electronically Signed   By: Rudie Meyer M.D.   On: 03/23/2020 18:32   US Abdomen Limited RUQ (LIVER/GB)  Result Date: 03/24/2020 CLINICAL DATA:  Elevated liver function tests. EXAM: ULTRASOUND ABDOMEN LIMITED RIGHT UPPER QUADRANT COMPARISON:  CT of the abdomen pelvis March 23, 2020 FINDINGS: Gallbladder: No gallstones or wall thickening visualized. No sonographic Murphy sign noted by sonographer. Common bile duct: Diameter: 4.2 mm Liver: No focal lesion identified. Diffusely increased heterogeneous parenchymal echogenicity. Portal vein is patent on color Doppler imaging with normal direction of blood flow towards the liver. Other: None. IMPRESSION: 1. Normal appearance of the gallbladder. 2. Diffusely increased heterogeneous parenchymal echogenicity of the liver, which may be seen in the setting of fatty infiltration or other hepatocellular disease. Electronically Signed   By: Ted Mcalpine M.D.   On: 03/24/2020 13:28    Anti-infectives: Anti-infectives (From admission, onward)   Start     Dose/Rate Route Frequency Ordered Stop   03/24/20 0500  metroNIDAZOLE (FLAGYL) IVPB 500 mg        500 mg 100  mL/hr over 60 Minutes Intravenous Every 8 hours 03/23/20 2002     03/23/20 1800  metroNIDAZOLE (FLAGYL) IVPB 500 mg        500 mg 100 mL/hr over 60 Minutes Intravenous  Once 03/23/20 1753 03/23/20 2012   03/23/20 1800  cefTRIAXone (ROCEPHIN) 2 g in sodium chloride 0.9 % 100 mL IVPB        2 g 200 mL/hr over 30 Minutes Intravenous Every 24 hours 03/23/20 1756       Assessment/Plan  Possible small bowel pneumatosis, enteritis  [place] of abdomen - 03/24/2020 - Negative  On Rocephin and Flagyl  He had labs ordered this AM, but they were not done ???  Anyway, he looks so good, there is no reason  for any labs.  I have written to start clear liquids and advance as tolerated.  (discussed with Dr. [place])  There are no acute surgical issues.  He probably needs follow up with a GI doctor, but he apparently already has someone he sees.  He has no acute surgical issue.  We will sign off.  There is no reason for follow up with [place] at this time.   Ovidio Kin, MD, [facility] Surgery Office phone:  [phone]

## 2020-03-25 NOTE — Plan of Care (Signed)
  Problem: Pain Managment: Goal: General experience of comfort will improve Outcome: Progressing   Problem: Safety: Goal: Ability to remain free from injury will improve Outcome: Progressing   Problem: Skin Integrity: Goal: Risk for impaired skin integrity will decrease Outcome: Progressing   

## 2020-03-25 NOTE — Progress Notes (Signed)
Discharge summary packet provided to pt with instructions. Pt verbalized understanding of instructions. No complaints voiced.Per pt his ride will be here around 6pmish.

## 2020-03-25 NOTE — Progress Notes (Signed)
D/C pt  As ordered. Pt remains alert/oriented in no apparent distress. No complaints voiced. All questions and concerns were answered. This Clinical research associate accompanied pt to a waiting van, Ambulates well without issue.

## 2020-03-28 LAB — CULTURE, BLOOD (SINGLE)
Culture: NO GROWTH
Special Requests: ADEQUATE

## 2020-04-12 ENCOUNTER — Encounter: Payer: Self-pay | Admitting: Internal Medicine

## 2020-04-26 ENCOUNTER — Encounter: Payer: Self-pay | Admitting: Nurse Practitioner

## 2020-06-05 ENCOUNTER — Ambulatory Visit: Payer: Managed Care, Other (non HMO) | Admitting: Nurse Practitioner

## 2020-06-06 ENCOUNTER — Telehealth: Payer: Self-pay | Admitting: *Deleted

## 2020-06-06 ENCOUNTER — Encounter: Payer: Self-pay | Admitting: *Deleted

## 2020-06-06 ENCOUNTER — Ambulatory Visit (INDEPENDENT_AMBULATORY_CARE_PROVIDER_SITE_OTHER): Payer: Managed Care, Other (non HMO) | Admitting: Nurse Practitioner

## 2020-06-06 ENCOUNTER — Encounter: Payer: Self-pay | Admitting: Nurse Practitioner

## 2020-06-06 ENCOUNTER — Other Ambulatory Visit: Payer: Self-pay

## 2020-06-06 VITALS — BP 142/82 | HR 78 | Temp 97.1°F | Ht 66.0 in | Wt 210.6 lb

## 2020-06-06 DIAGNOSIS — R1084 Generalized abdominal pain: Secondary | ICD-10-CM

## 2020-06-06 DIAGNOSIS — K6389 Other specified diseases of intestine: Secondary | ICD-10-CM | POA: Insufficient documentation

## 2020-06-06 DIAGNOSIS — R140 Abdominal distension (gaseous): Secondary | ICD-10-CM | POA: Diagnosis not present

## 2020-06-06 DIAGNOSIS — Z8719 Personal history of other diseases of the digestive system: Secondary | ICD-10-CM

## 2020-06-06 DIAGNOSIS — K921 Melena: Secondary | ICD-10-CM

## 2020-06-06 DIAGNOSIS — R7401 Elevation of levels of liver transaminase levels: Secondary | ICD-10-CM

## 2020-06-06 NOTE — Addendum Note (Signed)
Addended by: Delane Ginger, Daiana Vitiello A on: 06/06/2020 09:43 AM   Modules accepted: Orders

## 2020-06-06 NOTE — Telephone Encounter (Signed)
Spoke with [employer]. She is going to speak with him regarding scheduling TCS for 1/26 and let me know.

## 2020-06-06 NOTE — H&P (View-Only) (Signed)
Primary Care Physician:  Ignatius Specking, MD Primary Gastroenterologist:  Dr. Jena Gauss  Chief Complaint  Patient presents with  . pneumatosis intestinalis    HPI:   Adam Zimmerman is a 40 y.o. 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  Reviewed information provided with the referral including office visit dated 05/31/2019 which is a transition of care from the hospital due to diverticulitis.  The patient noted abdominal pain has been gradual and constant in the left lower quadrant.  Was diagnosed with pneumatosis intestinalis and diverticulitis during hospital stay, per primary care notes.  Recommended referral to GI.  The patient was recently admitted to [facility] from 03/23/2020 through 03/25/2020.  He presented with abdominal pain and was referred to the ED by his gastroenterologist (unsure of who) with CT of the abdomen and pelvis finding early pneumatosis in the jejunal wall.  History of diverticulitis felt to be likely cause.  Noted leukocytosis at that time with white blood cell count 13.4, elevated LFTs with AST/ALT of 45/92, other LFTs normal.  He was started on Rocephin and Flagyl, general surgery was consulted.  He was noted to be septic on admission with lactic acid of 2.6.  Leukocytosis resolved with antibiotic therapy, diet slowly advanced, no growth in blood cultures.  Recommended complete antibiotics as outpatient.  Referred to GI for consideration of colonoscopy in the near future.  Regarding his transaminitis the patient denied regular heavy alcohol use but is obese with a BMI of 32.9.  Right upper quadrant abdominal ultrasound completed 03/24/2020 which found normal gallbladder, diffusely increased heterogenous parenchymal echogenicity of the liver consistent with fatty liver disease.  Recommended evaluation by GI as an outpatient.   Reviewed outside CT dated 03/23/2020 Small areas of gas along the wall of the jejunum, some areas are anti  dependent and clearly outlined valvulae, other areas are  dependent and somewhat atypical for gas trapped in valvulae raising the question of subtle, early pneumatosis. Some wall thickening may be present but there is no evidence of mesenteric stranding, fluid  or portal venous gas. Vascular structures appear patent. Would correlate with any risk factors for enteritis and with lactate to exclude the possibility of early ischemia.  Also noted marked hepatic steatosis with signs of fatty sparing along the porta hepatis with similar appearance on prior imaging.  No history of colonoscopy or endoscopy in our system.  Today he states he is doing okay overall.He is not having pain, has a sensation of bloating/gas in the left side; notices more when bending forward. Feels like symptoms start when he has an empty stomach, improves after eating. Denies GERD symptoms. Symptoms are unchanged since hospitalization. Denies N/V. Has had one episode of hematochezia with known hemorrhoids. Denies melena, fever, chills, unintentional weight loss. Denies URI or flu-like symptoms. Denies loss of sense of taste or smell. The patient has received COVID-19 vaccination(s). They have to schedule the second dose.. Denies chest pain, dyspnea, dizziness, lightheadedness, syncope, near syncope. Denies any other upper or lower GI symptoms.  Has daily bowel movements, consistent with Bristol 4. Denies NSAIDs.  Denies any previous colonoscopy.  Not currently on any medications.  Past Medical History:  Diagnosis Date  . BMI 33.0-33.9,adult     Past Surgical History:  Procedure Laterality Date  . NO PAST SURGERIES      No current outpatient medications on file.   No current facility-administered medications for this visit.    Allergies as of  06/06/2020 - Review Complete 06/06/2020  Allergen Reaction Noted  . Amoxicillin Hives 04/09/2018    Family History  Problem Relation Age of Onset  . Healthy Mother   .  Healthy Father   . Colon cancer Neg Hx     Social History   Socioeconomic History  . Marital status: Single    Spouse name: Not on file  . Number of children: Not on file  . Years of education: Not on file  . Highest education level: Not on file  Occupational History  . Not on file  Tobacco Use  . Smoking status: Never Smoker  . Smokeless tobacco: Never Used  Vaping Use  . Vaping Use: Never used  Substance and Sexual Activity  . Alcohol use: Not Currently    Comment: occassional; denied 06/06/20  . Drug use: Never  . Sexual activity: Not on file  Other Topics Concern  . Not on file  Social History Narrative  . Not on file   Social Determinants of Health   Financial Resource Strain: Not on file  Food Insecurity: Not on file  Transportation Needs: Not on file  Physical Activity: Not on file  Stress: Not on file  Social Connections: Not on file  Intimate Partner Violence: Not on file    Subjective: Review of Systems  Constitutional: Negative for chills, fever, malaise/fatigue and weight loss.  HENT: Negative for congestion and sore throat.   Respiratory: Negative for cough and shortness of breath.   Cardiovascular: Negative for chest pain and palpitations.  Gastrointestinal: Negative for abdominal pain, blood in stool, constipation, diarrhea, melena, nausea and vomiting.       Bloating sensation  Musculoskeletal: Negative for joint pain and myalgias.  Skin: Negative for rash.  Neurological: Negative for dizziness and weakness.  Endo/Heme/Allergies: Does not bruise/bleed easily.  Psychiatric/Behavioral: Negative for depression. The patient is not nervous/anxious.   All other systems reviewed and are negative.      Objective: BP (!) 142/82   Pulse 78   Temp (!) 97.1 F (36.2 C) (Temporal)   Ht 5' 6" (1.676 m)   Wt 210 lb 9.6 oz (95.5 kg)   BMI 33.99 kg/m  Physical Exam Vitals and nursing note reviewed.  Constitutional:      General: He is not in acute  distress.    Appearance: Normal appearance. He is obese. He is not ill-appearing, toxic-appearing or diaphoretic.  HENT:     Head: Normocephalic and atraumatic.     Nose: No congestion or rhinorrhea.  Eyes:     General: No scleral icterus. Cardiovascular:     Rate and Rhythm: Normal rate and regular rhythm.     Heart sounds: Normal heart sounds.  Pulmonary:     Effort: Pulmonary effort is normal.     Breath sounds: Normal breath sounds.  Abdominal:     General: Bowel sounds are normal. There is no distension.     Palpations: Abdomen is soft. There is no hepatomegaly, splenomegaly or mass.     Tenderness: There is no abdominal tenderness. There is no guarding or rebound.     Hernia: No hernia is present.  Musculoskeletal:     Cervical back: Neck supple.  Skin:    General: Skin is warm and dry.     Coloration: Skin is not jaundiced.     Findings: No bruising or rash.  Neurological:     General: No focal deficit present.     Mental Status: He is alert and oriented   to person, place, and time. Mental status is at baseline.  Psychiatric:        Mood and Affect: Mood normal.        Behavior: Behavior normal.        Thought Content: Thought content normal.      Assessment:  Very pleasant 40 year old male with a history of diverticulosis, previous diverticulitis, obesity who presents on referral from primary care for follow-up on pneumatosis intestinalis.  The patient has CT scan in October 2021 which showed jejunal wall air with no features suggestive of portal venous gas or ischemia.  He was admitted to the hospital at [facility].  He was treated with antibiotics and discharged after couple days.  No red flag/warning signs or symptoms today.  Pneumatosis intestinalis: Appears to be mildly symptomatic with only complaint of "bloating" or an "odd sensation" in his left side.  Denies upper GI symptoms including GERD, nausea, vomiting.  No NSAIDs.  No changes in bowel movements, no  constipation.  No fevers or chills.  It has been about 2-1/2 months since his diagnosis and admission and appears to be doing quite well today.  We will plan for follow-up imaging, labs, and colonoscopy.  He may eventually need a capsule study as well given that his pneumatosis was in the jejunum, pending CT results  Previous diverticulitis: Patient notes he has been admitted for acute diverticulitis in the past.  No colonoscopy completed.  We will plan for colonoscopy to further evaluate his symptoms as well as routine follow-up related to his diverticular disease and hematochezia (as described below).  The patient does have mildly elevated transaminitis with imaging indicating likely fatty liver disease.  At this point we will work to resolve his most pressing clinical need with his pneumatosis intestinalis.  At follow-up we will plan further liver work-up.  Denies alcohol use.  Hematochezia with known hemorrhoids: He notes rare episodes of hematochezia (generally only 1 or 2 per his estimation) hematochezia but in the setting of known hemorrhoids.  However, he denies constipation and straining.  At this point because of his hematochezia we will plan for colonoscopy as well as to evaluate his symptoms above and for routine post diverticulitis evaluation.  Proceed with colonoscopy by Dr. Jena Gauss in near future: the risks, benefits, and alternatives have been discussed with the patient in detail. The patient states understanding and desires to proceed.  The patient is not on any medications.  Denies alcohol and drug use.  Conscious sedation should be adequate for his procedure.  ASA II   Plan: 1. CBC, CMP 2. CT of the abdomen and pelvis with contrast 3. Colonoscopy as described above 4. Follow-up in 3 months 5. Call for any worsening or recurrent symptoms.    Thank you for allowing [place] to participate in the care of [facility]  Wynne Dust, DNP, AGNP-C Adult & Gerontological Nurse  Practitioner [facility]   06/06/2020 9:08 AM   Disclaimer: This note was dictated with voice recognition software. Similar sounding words can inadvertently be transcribed and may not be corrected upon review.

## 2020-06-06 NOTE — Telephone Encounter (Signed)
[employer] and no PA is required for CT. Conf# [id number]

## 2020-06-06 NOTE — Telephone Encounter (Signed)
Patient gf returned call. Patient scheduled for TCS with Dr. Jena Gauss on 1/26 at 8:30am. Aware will leave prep instructions at front desk for p/u. Also COVID test scheduled for 1/24 at 3:55pm. Aware of location for this as well.

## 2020-06-06 NOTE — Patient Instructions (Signed)
Your health issues we discussed today were:   Air in the wall of your small intestines ("pneumatosis intestinalis") with a history of diverticulitis and bloating: 1. As discussed we will check labs.  Please have these completed soon as you can 2. I will also check a repeat CT scan of your abdomen and pelvis to see if this area is still there 3. We will also plan a colonoscopy to evaluate your colon 4. Further recommendations will follow  Elevated liver enzymes: 1. As we discussed, this is likely due to fatty liver disease and possibly due to some previous alcohol 2. Continue to avoid drinking alcohol for now 3. Work on diet and exercise for weight loss, cholesterol control, blood pressure control, blood sugar control 4. We will do further testing at your follow-up visit  Overall I recommend:  1. Continue other current medications 2. Return for follow-up 3 months 3. Call for any questions or concerns   ---------------------------------------------------------------  At [facility] we value your feedback. You may receive a survey about your visit today. Please share your experience as we strive to create trusting relationships with our patients to provide genuine, compassionate, quality care.  We appreciate your understanding and patience as we review any laboratory studies, imaging, and other diagnostic tests that are ordered as we care for you. Our office policy is 5 business days for review of these results, and any emergent or urgent results are addressed in a timely manner for your best interest. If you do not hear from our office in 1 week, please contact us.   We also encourage the use of MyChart, which contains your medical information for your review as well. If you are not enrolled in this feature, an access code is on this after visit summary for your convenience. Thank you for allowing [place] to be involved in your care.  It was great to see you today!  I hope you  have a Winter, stay warm!!    ---------------------------------------------------------------

## 2020-06-06 NOTE — Progress Notes (Signed)
Primary Care Physician:  Ignatius Specking, MD Primary Gastroenterologist:  Dr. Jena Gauss  Chief Complaint  Patient presents with  . pneumatosis intestinalis    HPI:   Adam Zimmerman is a 40 y.o. 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  Reviewed information provided with the referral including office visit dated 05/31/2019 which is a transition of care from the hospital due to diverticulitis.  The patient noted abdominal pain has been gradual and constant in the left lower quadrant.  Was diagnosed with pneumatosis intestinalis and diverticulitis during hospital stay, per primary care notes.  Recommended referral to GI.  The patient was recently admitted to [facility] from 03/23/2020 through 03/25/2020.  He presented with abdominal pain and was referred to the ED by his gastroenterologist (unsure of who) with CT of the abdomen and pelvis finding early pneumatosis in the jejunal wall.  History of diverticulitis felt to be likely cause.  Noted leukocytosis at that time with white blood cell count 13.4, elevated LFTs with AST/ALT of 45/92, other LFTs normal.  He was started on Rocephin and Flagyl, general surgery was consulted.  He was noted to be septic on admission with lactic acid of 2.6.  Leukocytosis resolved with antibiotic therapy, diet slowly advanced, no growth in blood cultures.  Recommended complete antibiotics as outpatient.  Referred to GI for consideration of colonoscopy in the near future.  Regarding his transaminitis the patient denied regular heavy alcohol use but is obese with a BMI of 32.9.  Right upper quadrant abdominal ultrasound completed 03/24/2020 which found normal gallbladder, diffusely increased heterogenous parenchymal echogenicity of the liver consistent with fatty liver disease.  Recommended evaluation by GI as an outpatient.   Reviewed outside CT dated 03/23/2020 Small areas of gas along the wall of the jejunum, some areas are anti  dependent and clearly outlined valvulae, other areas are  dependent and somewhat atypical for gas trapped in valvulae raising the question of subtle, early pneumatosis. Some wall thickening may be present but there is no evidence of mesenteric stranding, fluid  or portal venous gas. Vascular structures appear patent. Would correlate with any risk factors for enteritis and with lactate to exclude the possibility of early ischemia.  Also noted marked hepatic steatosis with signs of fatty sparing along the porta hepatis with similar appearance on prior imaging.  No history of colonoscopy or endoscopy in our system.  Today he states he is doing okay overall.He is not having pain, has a sensation of bloating/gas in the left side; notices more when bending forward. Feels like symptoms start when he has an empty stomach, improves after eating. Denies GERD symptoms. Symptoms are unchanged since hospitalization. Denies N/V. Has had one episode of hematochezia with known hemorrhoids. Denies melena, fever, chills, unintentional weight loss. Denies URI or flu-like symptoms. Denies loss of sense of taste or smell. The patient has received COVID-19 vaccination(s). They have to schedule the second dose.. Denies chest pain, dyspnea, dizziness, lightheadedness, syncope, near syncope. Denies any other upper or lower GI symptoms.  Has daily bowel movements, consistent with Bristol 4. Denies NSAIDs.  Denies any previous colonoscopy.  Not currently on any medications.  Past Medical History:  Diagnosis Date  . BMI 33.0-33.9,adult     Past Surgical History:  Procedure Laterality Date  . NO PAST SURGERIES      No current outpatient medications on file.   No current facility-administered medications for this visit.    Allergies as of  06/06/2020 - Review Complete 06/06/2020  Allergen Reaction Noted  . Amoxicillin Hives 04/09/2018    Family History  Problem Relation Age of Onset  . Healthy Mother   .  Healthy Father   . Colon cancer Neg Hx     Social History   Socioeconomic History  . Marital status: Single    Spouse name: Not on file  . Number of children: Not on file  . Years of education: Not on file  . Highest education level: Not on file  Occupational History  . Not on file  Tobacco Use  . Smoking status: Never Smoker  . Smokeless tobacco: Never Used  Vaping Use  . Vaping Use: Never used  Substance and Sexual Activity  . Alcohol use: Not Currently    Comment: occassional; denied 06/06/20  . Drug use: Never  . Sexual activity: Not on file  Other Topics Concern  . Not on file  Social History Narrative  . Not on file   Social Determinants of Health   Financial Resource Strain: Not on file  Food Insecurity: Not on file  Transportation Needs: Not on file  Physical Activity: Not on file  Stress: Not on file  Social Connections: Not on file  Intimate Partner Violence: Not on file    Subjective: Review of Systems  Constitutional: Negative for chills, fever, malaise/fatigue and weight loss.  HENT: Negative for congestion and sore throat.   Respiratory: Negative for cough and shortness of breath.   Cardiovascular: Negative for chest pain and palpitations.  Gastrointestinal: Negative for abdominal pain, blood in stool, constipation, diarrhea, melena, nausea and vomiting.       Bloating sensation  Musculoskeletal: Negative for joint pain and myalgias.  Skin: Negative for rash.  Neurological: Negative for dizziness and weakness.  Endo/Heme/Allergies: Does not bruise/bleed easily.  Psychiatric/Behavioral: Negative for depression. The patient is not nervous/anxious.   All other systems reviewed and are negative.      Objective: BP (!) 142/82   Pulse 78   Temp (!) 97.1 F (36.2 C) (Temporal)   Ht 5\' 6"  (1.676 m)   Wt 210 lb 9.6 oz (95.5 kg)   BMI 33.99 kg/m  Physical Exam Vitals and nursing note reviewed.  Constitutional:      General: He is not in acute  distress.    Appearance: Normal appearance. He is obese. He is not ill-appearing, toxic-appearing or diaphoretic.  HENT:     Head: Normocephalic and atraumatic.     Nose: No congestion or rhinorrhea.  Eyes:     General: No scleral icterus. Cardiovascular:     Rate and Rhythm: Normal rate and regular rhythm.     Heart sounds: Normal heart sounds.  Pulmonary:     Effort: Pulmonary effort is normal.     Breath sounds: Normal breath sounds.  Abdominal:     General: Bowel sounds are normal. There is no distension.     Palpations: Abdomen is soft. There is no hepatomegaly, splenomegaly or mass.     Tenderness: There is no abdominal tenderness. There is no guarding or rebound.     Hernia: No hernia is present.  Musculoskeletal:     Cervical back: Neck supple.  Skin:    General: Skin is warm and dry.     Coloration: Skin is not jaundiced.     Findings: No bruising or rash.  Neurological:     General: No focal deficit present.     Mental Status: He is alert and oriented  to person, place, and time. Mental status is at baseline.  Psychiatric:        Mood and Affect: Mood normal.        Behavior: Behavior normal.        Thought Content: Thought content normal.      Assessment:  Very pleasant 40 year old male with a history of diverticulosis, previous diverticulitis, obesity who presents on referral from primary care for follow-up on pneumatosis intestinalis.  The patient has CT scan in October 2021 which showed jejunal wall air with no features suggestive of portal venous gas or ischemia.  He was admitted to the hospital at [facility].  He was treated with antibiotics and discharged after couple days.  No red flag/warning signs or symptoms today.  Pneumatosis intestinalis: Appears to be mildly symptomatic with only complaint of "bloating" or an "odd sensation" in his left side.  Denies upper GI symptoms including GERD, nausea, vomiting.  No NSAIDs.  No changes in bowel movements, no  constipation.  No fevers or chills.  It has been about 2-1/2 months since his diagnosis and admission and appears to be doing quite well today.  We will plan for follow-up imaging, labs, and colonoscopy.  He may eventually need a capsule study as well given that his pneumatosis was in the jejunum, pending CT results  Previous diverticulitis: Patient notes he has been admitted for acute diverticulitis in the past.  No colonoscopy completed.  We will plan for colonoscopy to further evaluate his symptoms as well as routine follow-up related to his diverticular disease and hematochezia (as described below).  The patient does have mildly elevated transaminitis with imaging indicating likely fatty liver disease.  At this point we will work to resolve his most pressing clinical need with his pneumatosis intestinalis.  At follow-up we will plan further liver work-up.  Denies alcohol use.  Hematochezia with known hemorrhoids: He notes rare episodes of hematochezia (generally only 1 or 2 per his estimation) hematochezia but in the setting of known hemorrhoids.  However, he denies constipation and straining.  At this point because of his hematochezia we will plan for colonoscopy as well as to evaluate his symptoms above and for routine post diverticulitis evaluation.  Proceed with colonoscopy by Dr. Jena Gauss in near future: the risks, benefits, and alternatives have been discussed with the patient in detail. The patient states understanding and desires to proceed.  The patient is not on any medications.  Denies alcohol and drug use.  Conscious sedation should be adequate for his procedure.  ASA II   Plan: 1. CBC, CMP 2. CT of the abdomen and pelvis with contrast 3. Colonoscopy as described above 4. Follow-up in 3 months 5. Call for any worsening or recurrent symptoms.    Thank you for allowing [place] to participate in the care of [facility]  Wynne Dust, DNP, AGNP-C Adult & Gerontological Nurse  Practitioner [facility]   06/06/2020 9:08 AM   Disclaimer: This note was dictated with voice recognition software. Similar sounding words can inadvertently be transcribed and may not be corrected upon review.

## 2020-06-07 LAB — COMPREHENSIVE METABOLIC PANEL
AG Ratio: 1.7 (calc) (ref 1.0–2.5)
ALT: 68 U/L — ABNORMAL HIGH (ref 9–46)
AST: 33 U/L (ref 10–40)
Albumin: 4.9 g/dL (ref 3.6–5.1)
Alkaline phosphatase (APISO): 47 U/L (ref 36–130)
BUN: 12 mg/dL (ref 7–25)
CO2: 21 mmol/L (ref 20–32)
Calcium: 9.7 mg/dL (ref 8.6–10.3)
Chloride: 109 mmol/L (ref 98–110)
Creat: 0.8 mg/dL (ref 0.60–1.35)
Globulin: 2.9 g/dL (calc) (ref 1.9–3.7)
Glucose, Bld: 97 mg/dL (ref 65–139)
Potassium: 4.4 mmol/L (ref 3.5–5.3)
Sodium: 140 mmol/L (ref 135–146)
Total Bilirubin: 0.6 mg/dL (ref 0.2–1.2)
Total Protein: 7.8 g/dL (ref 6.1–8.1)

## 2020-06-07 LAB — CBC WITH DIFFERENTIAL/PLATELET
Absolute Monocytes: 319 cells/uL (ref 200–950)
Basophils Absolute: 68 cells/uL (ref 0–200)
Basophils Relative: 1.2 %
Eosinophils Absolute: 137 cells/uL (ref 15–500)
Eosinophils Relative: 2.4 %
HCT: 45.9 % (ref 38.5–50.0)
Hemoglobin: 15.6 g/dL (ref 13.2–17.1)
Lymphs Abs: 2046 cells/uL (ref 850–3900)
MCH: 29.6 pg (ref 27.0–33.0)
MCHC: 34 g/dL (ref 32.0–36.0)
MCV: 87.1 fL (ref 80.0–100.0)
MPV: 11 fL (ref 7.5–12.5)
Monocytes Relative: 5.6 %
Neutro Abs: 3129 cells/uL (ref 1500–7800)
Neutrophils Relative %: 54.9 %
Platelets: 307 10*3/uL (ref 140–400)
RBC: 5.27 10*6/uL (ref 4.20–5.80)
RDW: 12.6 % (ref 11.0–15.0)
Total Lymphocyte: 35.9 %
WBC: 5.7 10*3/uL (ref 3.8–10.8)

## 2020-06-18 ENCOUNTER — Other Ambulatory Visit (HOSPITAL_COMMUNITY)
Admission: RE | Admit: 2020-06-18 | Discharge: 2020-06-18 | Disposition: A | Discharge status: Home / Self Care | Payer: Managed Care, Other (non HMO) | Source: Ambulatory Visit | Attending: Internal Medicine | Admitting: Internal Medicine

## 2020-06-18 ENCOUNTER — Other Ambulatory Visit: Payer: Self-pay

## 2020-06-18 DIAGNOSIS — Z01812 Encounter for preprocedural laboratory examination: Secondary | ICD-10-CM | POA: Diagnosis present

## 2020-06-18 DIAGNOSIS — Z20822 Contact with and (suspected) exposure to covid-19: Secondary | ICD-10-CM | POA: Diagnosis not present

## 2020-06-19 LAB — SARS CORONAVIRUS 2 (TAT 6-24 HRS): SARS Coronavirus 2: NEGATIVE

## 2020-06-20 ENCOUNTER — Ambulatory Visit (HOSPITAL_COMMUNITY)
Admission: RE | Admit: 2020-06-20 | Discharge: 2020-06-20 | Disposition: A | Discharge status: Home / Self Care | Payer: Managed Care, Other (non HMO) | Attending: Internal Medicine | Admitting: Internal Medicine

## 2020-06-20 ENCOUNTER — Other Ambulatory Visit: Payer: Self-pay

## 2020-06-20 ENCOUNTER — Encounter (HOSPITAL_COMMUNITY): Payer: Self-pay | Admitting: Internal Medicine

## 2020-06-20 ENCOUNTER — Encounter (HOSPITAL_COMMUNITY)
Admission: RE | Disposition: A | Discharge status: Home / Self Care | Payer: Self-pay | Source: Home / Self Care | Attending: Internal Medicine

## 2020-06-20 DIAGNOSIS — Z880 Allergy status to penicillin: Secondary | ICD-10-CM | POA: Insufficient documentation

## 2020-06-20 DIAGNOSIS — R933 Abnormal findings on diagnostic imaging of other parts of digestive tract: Secondary | ICD-10-CM | POA: Diagnosis not present

## 2020-06-20 DIAGNOSIS — Z8719 Personal history of other diseases of the digestive system: Secondary | ICD-10-CM | POA: Diagnosis not present

## 2020-06-20 HISTORY — PX: COLONOSCOPY: SHX5424

## 2020-06-20 SURGERY — COLONOSCOPY
Anesthesia: Moderate Sedation

## 2020-06-20 MED ORDER — MIDAZOLAM HCL 5 MG/5ML IJ SOLN
INTRAMUSCULAR | Status: DC | PRN
  Administered 2020-06-20: 1 mg via INTRAVENOUS
  Administered 2020-06-20: 2 mg via INTRAVENOUS
  Administered 2020-06-20: 1 mg via INTRAVENOUS

## 2020-06-20 MED ORDER — ONDANSETRON HCL 4 MG/2ML IJ SOLN
INTRAMUSCULAR | Status: AC
  Filled 2020-06-20: qty 2

## 2020-06-20 MED ORDER — MIDAZOLAM HCL 5 MG/5ML IJ SOLN
INTRAMUSCULAR | Status: AC
  Filled 2020-06-20: qty 10

## 2020-06-20 MED ORDER — SODIUM CHLORIDE 0.9 % IV SOLN: INTRAVENOUS | Status: DC

## 2020-06-20 MED ORDER — STERILE WATER FOR IRRIGATION IR SOLN
Status: DC | PRN
  Administered 2020-06-20: 100 mL

## 2020-06-20 MED ORDER — MEPERIDINE HCL 100 MG/ML IJ SOLN
INTRAMUSCULAR | Status: DC | PRN
  Administered 2020-06-20: 40 mg via INTRAVENOUS
  Administered 2020-06-20: 10 mg via INTRAVENOUS

## 2020-06-20 MED ORDER — MEPERIDINE HCL 50 MG/ML IJ SOLN
INTRAMUSCULAR | Status: AC
  Filled 2020-06-20: qty 1

## 2020-06-20 MED ORDER — ONDANSETRON HCL 4 MG/2ML IJ SOLN
INTRAMUSCULAR | Status: DC | PRN
  Administered 2020-06-20: 4 mg via INTRAVENOUS

## 2020-06-20 NOTE — Op Note (Signed)
[facility] Patient Name: Adam Zimmerman Procedure Date: 06/20/2020 8:05 AM MRN: [id number] Date of Birth: [birth year] Attending MD: Gennette Pac , MD CSN: [id number] Age: 40 Admit Type: Outpatient Procedure:                Colonoscopy Indications:              Abnormal CT of the GI tract Providers:                Gennette Pac, MD, Edrick Kins, RN, Edythe Clarity, Technician Referring MD:              Medicines:                Midazolam 4 mg IV, Meperidine 50 mg IV Complications:            No immediate complications. Estimated Blood Loss:     Estimated blood loss: none. Procedure:                Pre-Anesthesia Assessment:                           - Prior to the procedure, a History and Physical                            was performed, and patient medications and                            allergies were reviewed. The patient's tolerance of                            previous anesthesia was also reviewed. The risks                            and benefits of the procedure and the sedation                            options and risks were discussed with the patient.                            All questions were answered, and informed consent                            was obtained. Prior Anticoagulants: The patient has                            taken no previous anticoagulant or antiplatelet                            agents. ASA Grade Assessment: II - A patient with                            mild systemic disease. After reviewing the risks  and benefits, the patient was deemed in                            satisfactory condition to undergo the procedure.                           After obtaining informed consent, the colonoscope                            was passed under direct vision. Throughout the                            procedure, the patient's blood pressure, pulse, and                            oxygen  saturations were monitored continuously. The                            CF-[id number] ([id number]) scope was introduced through                            the anus and advanced to the 5 cm into the ileum.                            The colonoscopy was performed without difficulty.                            The patient tolerated the procedure well. The                            quality of the bowel preparation was adequate. The                            entire colon was well visualized. The terminal                            ileum, ileocecal valve, appendiceal orifice, and                            rectum were photographed. Scope In: 8:42:18 AM Scope Out: 8:56:06 AM Scope Withdrawal Time: 0 hours 10 minutes 46 seconds  Total Procedure Duration: 0 hours 13 minutes 48 seconds  Findings:      The perianal and digital rectal examinations were normal.      The colon (entire examined portion) appeared normal. Normal distal 5 cm       of terminal ileum.      The retroflexed view of the distal rectum and anal verge was normal and       showed no anal or rectal abnormalities. I did not identify a single       diverticulum in the colon Impression:               - The entire examined colon is normal.                           -  The distal rectum and anal verge are normal on                            retroflexion view.                           - No specimens collected. I suspect CT findings                            nonspecific in the setting of acute illness.                            Clinically, illness has resolved. Moderate Sedation:      Moderate (conscious) sedation was personally administered by an       anesthesia professional. The following parameters were monitored: oxygen       saturation, heart rate, blood pressure, and response to care. Total       physician intraservice time was 19 minutes. Recommendation:           - Patient has a contact number available for                             emergencies. The signs and symptoms of potential                            delayed complications were discussed with the                            patient. Return to normal activities tomorrow.                            Written discharge instructions were provided to the                            patient.                           - Advance diet as tolerated.                           - Continue present medications.                           - Repeat colonoscopy at age 67 for screening                            purposes. Follow-up appointment in several weeks as                            scheduled. Procedure Code(s):        --- Professional ---                           [phone], Colonoscopy, flexible; diagnostic, including  collection of specimen(s) by brushing or washing,                            when performed (separate procedure) Diagnosis Code(s):        --- Professional ---                           R93.3, Abnormal findings on diagnostic imaging of                            other parts of digestive tract CPT copyright 2019 American Medical Association. All rights reserved. The codes documented in this report are preliminary and upon coder review may  be revised to meet current compliance requirements. Gerrit Friends. Carin Shipp, MD Gennette Pac, MD 06/20/2020 9:42:38 AM This report has been signed electronically. Number of Addenda: 0

## 2020-06-20 NOTE — Discharge Instructions (Signed)
°  Colonoscopy Discharge Instructions  Read the instructions outlined below and refer to this sheet in the next few weeks. These discharge instructions provide you with general information on caring for yourself after you leave the hospital. Your doctor may also give you specific instructions. While your treatment has been planned according to the most current medical practices available, unavoidable complications occasionally occur. If you have any problems or questions after discharge, call Dr. Jena Gauss at [phone]. ACTIVITY  You may resume your regular activity, but move at a slower pace for the next 24 hours.   Take frequent rest periods for the next 24 hours.   Walking will help get rid of the air and reduce the bloated feeling in your belly (abdomen).   No driving for 24 hours (because of the medicine (anesthesia) used during the test).    Do not sign any important legal documents or operate any machinery for 24 hours (because of the anesthesia used during the test).  NUTRITION  Drink plenty of fluids.   You may resume your normal diet as instructed by your doctor.   Begin with a light meal and progress to your normal diet. Heavy or fried foods are harder to digest and may make you feel sick to your stomach (nauseated).   Avoid alcoholic beverages for 24 hours or as instructed.  MEDICATIONS  You may resume your normal medications unless your doctor tells you otherwise.  WHAT YOU CAN EXPECT TODAY  Some feelings of bloating in the abdomen.   Passage of more gas than usual.   Spotting of blood in your stool or on the toilet paper.  IF YOU HAD POLYPS REMOVED DURING THE COLONOSCOPY:  No aspirin products for 7 days or as instructed.   No alcohol for 7 days or as instructed.   Eat a soft diet for the next 24 hours.  FINDING OUT THE RESULTS OF YOUR TEST Not all test results are available during your visit. If your test results are not back during the visit, make an appointment  with your caregiver to find out the results. Do not assume everything is normal if you have not heard from your caregiver or the medical facility. It is important for you to follow up on all of your test results.  SEEK IMMEDIATE MEDICAL ATTENTION IF:  You have more than a spotting of blood in your stool.   Your belly is swollen (abdominal distention).   You are nauseated or vomiting.   You have a temperature over 101.   You have abdominal pain or discomfort that is severe or gets worse throughout the day.   Your colonoscopy was normal today  Recommend repeat colonoscopy age 40 for screening  Upcoming office visit with [place] as scheduled  Patient request, called Marchelle Folks at [phone] -got voicemail.  Left a message.

## 2020-06-20 NOTE — Interval H&P Note (Signed)
History and Physical Interval Note:  06/20/2020 8:28 AM  Adam Zimmerman  has presented today for surgery, with the diagnosis of hx diverticulitis, hematochezia, pneumatosis intestinalis.  The various methods of treatment have been discussed with the patient and family. After consideration of risks, benefits and other options for treatment, the patient has consented to  Procedure(s) with comments: COLONOSCOPY (N/A) - 8:30am as a surgical intervention.  The patient's history has been reviewed, patient examined, no change in status, stable for surgery.  I have reviewed the patient's chart and labs.  Questions were answered to the patient's satisfaction.     [name]  Patient seen and examined.  No change.  No abdominal pain.  Diagnostic colonoscopy today per plan.  The risks, benefits, limitations, alternatives and imponderables have been reviewed with the patient. Questions have been answered. All parties are agreeable.

## 2020-06-22 ENCOUNTER — Encounter (HOSPITAL_COMMUNITY): Payer: Self-pay | Admitting: Internal Medicine

## 2020-06-26 ENCOUNTER — Telehealth: Payer: Self-pay | Admitting: *Deleted

## 2020-06-26 NOTE — Telephone Encounter (Signed)
Received call from [facility] patient insurance requires PA for CT A/P. [employer] and PA done via telephone. Tracking# [id number]. Records faxed to [phone]

## 2020-06-28 NOTE — Telephone Encounter (Signed)
PA approved [id number] DOS 06/26/2020-09/24/2020

## 2020-06-29 ENCOUNTER — Ambulatory Visit (HOSPITAL_COMMUNITY): Payer: Managed Care, Other (non HMO)

## 2020-07-27 ENCOUNTER — Other Ambulatory Visit: Payer: Self-pay

## 2020-07-27 ENCOUNTER — Ambulatory Visit (HOSPITAL_COMMUNITY)
Admission: RE | Admit: 2020-07-27 | Discharge: 2020-07-27 | Disposition: A | Discharge status: Home / Self Care | Payer: Managed Care, Other (non HMO) | Source: Ambulatory Visit | Attending: Nurse Practitioner | Admitting: Nurse Practitioner

## 2020-07-27 DIAGNOSIS — R1084 Generalized abdominal pain: Secondary | ICD-10-CM | POA: Insufficient documentation

## 2020-07-27 DIAGNOSIS — R7401 Elevation of levels of liver transaminase levels: Secondary | ICD-10-CM | POA: Diagnosis present

## 2020-07-27 DIAGNOSIS — Z8719 Personal history of other diseases of the digestive system: Secondary | ICD-10-CM | POA: Insufficient documentation

## 2020-07-27 DIAGNOSIS — R140 Abdominal distension (gaseous): Secondary | ICD-10-CM | POA: Insufficient documentation

## 2020-07-27 DIAGNOSIS — K921 Melena: Secondary | ICD-10-CM | POA: Insufficient documentation

## 2020-07-27 DIAGNOSIS — K6389 Other specified diseases of intestine: Secondary | ICD-10-CM | POA: Insufficient documentation

## 2020-07-27 MED ORDER — IOHEXOL 300 MG/ML  SOLN
100.0000 mL | Freq: Once | INTRAMUSCULAR | Status: AC | PRN
  Administered 2020-07-27: 100 mL via INTRAVENOUS

## 2020-08-30 ENCOUNTER — Encounter: Payer: Self-pay | Admitting: Nurse Practitioner

## 2020-08-30 ENCOUNTER — Ambulatory Visit (INDEPENDENT_AMBULATORY_CARE_PROVIDER_SITE_OTHER): Payer: Managed Care, Other (non HMO) | Admitting: Nurse Practitioner

## 2020-08-30 ENCOUNTER — Other Ambulatory Visit: Payer: Self-pay

## 2020-08-30 ENCOUNTER — Other Ambulatory Visit: Payer: Self-pay | Admitting: Nurse Practitioner

## 2020-08-30 VITALS — BP 131/84 | HR 83 | Temp 97.5°F | Ht 66.0 in | Wt 213.8 lb

## 2020-08-30 DIAGNOSIS — K760 Fatty (change of) liver, not elsewhere classified: Secondary | ICD-10-CM | POA: Diagnosis not present

## 2020-08-30 DIAGNOSIS — Z8719 Personal history of other diseases of the digestive system: Secondary | ICD-10-CM | POA: Diagnosis not present

## 2020-08-30 DIAGNOSIS — K6389 Other specified diseases of intestine: Secondary | ICD-10-CM

## 2020-08-30 DIAGNOSIS — R7401 Elevation of levels of liver transaminase levels: Secondary | ICD-10-CM

## 2020-08-30 DIAGNOSIS — R1084 Generalized abdominal pain: Secondary | ICD-10-CM

## 2020-08-30 NOTE — Patient Instructions (Addendum)
Your health issues we discussed today were:   Previous colon abnormalities: 1. As discussed, your colonoscopy looked great 2. It appears that your previous illness causing changes in your colon has resolved 3. Call us if you have any worsening or severe/recurrent symptoms  Fatty liver disease with elevated liver enzymes: 1. As discussed, your liver enzymes are improved, although one is still mildly elevated 2. I will recheck your liver enzymes today.  Have this done when you can 3. As we discussed, the key to treating fatty liver disease is diet and exercise for weight loss, blood sugar control, blood pressure control, cholesterol/triglyceride control 4. Try to increase your exercise and improve your diet 5. I am printing further information below 6. Call us if you would like a referral to a dietitian or to a medical weight loss clinic that can help you  Overall I recommend:  1. Continue other current medications 2. Return for follow-up in 6 months 3. Call us for any questions or concerns   ---------------------------------------------------------------  At [facility] we value your feedback. You may receive a survey about your visit today. Please share your experience as we strive to create trusting relationships with our patients to provide genuine, compassionate, quality care.  We appreciate your understanding and patience as we review any laboratory studies, imaging, and other diagnostic tests that are ordered as we care for you. Our office policy is 5 business days for review of these results, and any emergent or urgent results are addressed in a timely manner for your best interest. If you do not hear from our office in 1 week, please contact us.   We also encourage the use of MyChart, which contains your medical information for your review as well. If you are not enrolled in this feature, an access code is on this after visit summary for your convenience. Thank you  for allowing us to be involved in your care.  It was great to see you today!  I hope you have a great spring!!       ---------------------------------------------------------------  Fatty Liver Disease  The liver converts food into energy, removes toxic material from the blood, makes important proteins, and absorbs necessary vitamins from food. Fatty liver disease occurs when too much fat has built up in your liver cells. Fatty liver disease is also called hepatic steatosis. In many cases, fatty liver disease does not cause symptoms or problems. It is often diagnosed when tests are being done for other reasons. However, over time, fatty liver can cause inflammation that may lead to more serious liver problems, such as scarring of the liver (cirrhosis) and liver failure. Fatty liver is associated with insulin resistance, increased body fat, high blood pressure (hypertension), and high cholesterol. These are features of metabolic syndrome and increase your risk for stroke, diabetes, and heart disease. What are the causes? This condition may be caused by components of metabolic syndrome:  Obesity.  Insulin resistance.  High cholesterol. Other causes:  Alcohol abuse.  Poor nutrition.  Cushing syndrome.  Pregnancy.  Certain drugs.  Poisons.  Some viral infections. What increases the risk? You are more likely to develop this condition if you:  Abuse alcohol.  Are overweight.  Have diabetes.  Have hepatitis.  Have a high triglyceride level.  Are pregnant. What are the signs or symptoms? Fatty liver disease often does not cause symptoms. If symptoms do develop, they can include:  Fatigue and weakness.  Weight loss.  Confusion.  Nausea, vomiting, or abdominal  pain.  Yellowing of your skin and the white parts of your eyes (jaundice).  Itchy skin. How is this diagnosed? This condition may be diagnosed by:  A physical exam and your medical history.  Blood  tests.  Imaging tests, such as an ultrasound, CT scan, or MRI.  A liver biopsy. A small sample of liver tissue is removed using a needle. The sample is then looked at under a microscope. How is this treated? Fatty liver disease is often caused by other health conditions. Treatment for fatty liver may involve medicines and lifestyle changes to manage conditions such as:  Alcoholism.  High cholesterol.  Diabetes.  Being overweight or obese. Follow these instructions at home:  Do not drink alcohol. If you have trouble quitting, ask your health care provider how to safely quit with the help of medicine or a supervised program. This is important to keep your condition from getting worse.  Eat a healthy diet as told by your health care provider. Ask your health care provider about working with a dietitian to develop an eating plan.  Exercise regularly. This can help you lose weight and control your cholesterol and diabetes. Talk to your health care provider about an exercise plan and which activities are best for you.  Take over-the-counter and prescription medicines only as told by your health care provider.  Keep all follow-up visits. This is important.   Contact a health care provider if:  You have trouble controlling your: ? Blood sugar. This is especially important if you have diabetes. ? Cholesterol. ? Drinking of alcohol. Get help right away if:  You have abdominal pain.  You have jaundice.  You have nausea and are vomiting.  You vomit blood or material that looks like coffee grounds.  You have stools that are black, tar-like, or bloody. Summary  Fatty liver disease develops when too much fat builds up in the cells of your liver.  Fatty liver disease often causes no symptoms or problems. However, over time, fatty liver can cause inflammation that may lead to more serious liver problems, such as scarring of the liver (cirrhosis).  You are more likely to develop this  condition if you abuse alcohol, are pregnant, are overweight, have diabetes, have hepatitis, or have high triglyceride or cholesterol levels.  Contact your health care provider if you have trouble controlling your blood sugar, cholesterol, or drinking of alcohol. This information is not intended to replace advice given to you by your health care provider. Make sure you discuss any questions you have with your health care provider. Document Revised: 02/23/2020 Document Reviewed: 02/23/2020 Elsevier Patient Education  2021 Elsevier Inc.    Nonalcoholic Fatty Liver Disease Diet, Adult Nonalcoholic fatty liver disease is a condition that causes fat to build up in and around the liver. The disease makes it harder for the liver to work the way that it should. Following a healthy diet can help to keep nonalcoholic fatty liver disease under control. It can also help to prevent or improve conditions that are associated with the disease, such as heart disease, diabetes, high blood pressure, and abnormal cholesterol levels. Along with regular exercise, this diet:  Promotes weight loss.  Helps to control blood sugar levels.  Helps to improve the way that the body uses insulin. What are tips for following this plan? Reading food labels Always check food labels for:  The amount of saturated fat in a food. You should limit your intake of saturated fat. Saturated fat is found  in foods that come from animals, including meat and dairy products such as butter, cheese, and whole milk.  The amount of fiber in a food. You should choose high-fiber foods such as fruits, vegetables, and whole grains. Try to get 25-30 grams (g) of fiber a day.   Cooking  When cooking, use heart-healthy oils that are high in monounsaturated fats. These include olive oil, canola oil, and avocado oil.  Limit frying or deep-frying foods. Cook foods using healthy methods such as baking, boiling, steaming, and grilling instead. Meal  planning  You may want to keep track of how many calories you take in. Eating the right amount of calories will help you achieve a healthy weight. Meeting with a registered dietitian can help you get started.  Limit how often you eat takeout and fast food. These foods are usually very high in fat, salt, and sugar.  Use the glycemic index (GI) to plan your meals. The index tells you how quickly a food will raise your blood sugar. Choose low-GI foods (GI less than 55). These foods take a longer time to raise blood sugar. A registered dietitian can help you identify foods lower on the GI scale. Lifestyle  You may want to follow a Mediterranean diet. This diet includes a lot of vegetables, lean meats or fish, whole grains, fruits, and healthy oils and fats. What foods can I eat? Fruits Bananas. Apples. Oranges. Grapes. Papaya. Mango. Pomegranate. Kiwi. Grapefruit. Cherries. Vegetables Lettuce. Spinach. Peas. Beets. Cauliflower. Cabbage. Broccoli. Carrots. Tomatoes. Squash. Eggplant. Herbs. Peppers. Onions. Cucumbers. Brussels sprouts. Yams and sweet potatoes. Beans. Lentils. Grains Whole wheat or whole-grain foods, including breads, crackers, cereals, and pasta. Stone-ground whole wheat. Unsweetened oatmeal. Bulgur. Barley. Quinoa. Brown or wild rice. Corn or whole wheat flour tortillas. Meats and other proteins Lean meats. Poultry. Tofu. Seafood and shellfish. Dairy Low-fat or fat-free dairy products, such as yogurt, cottage cheese, or cheese. Beverages Water. Sugar-free drinks. Tea. Coffee. Low-fat or skim milk. Milk alternatives, such as soy or almond milk. Real fruit juice. Fats and oils Avocado. Canola or olive oil. Nuts and nut butters. Seeds. Seasonings and condiments Mustard. Relish. Low-fat, low-sugar ketchup and barbecue sauce. Low-fat or fat-free mayonnaise. Sweets and desserts Sugar-free sweets. The items listed above may not be a complete list of foods and beverages you can eat.  Contact a dietitian for more information.   What foods should I limit or avoid? Meats and other proteins Limit red meat to 1-2 times a week. Dairy [employer]. Fats and oils Palm oil and coconut oil. Fried foods. Other foods Processed foods. Foods that contain a lot of salt or sodium. Sweets and desserts Sweets that contain sugar. Beverages Sweetened drinks, such as sweet tea, milkshakes, iced sweet drinks, and sodas. Alcohol. The items listed above may not be a complete list of foods and beverages you should avoid. Contact a dietitian for more information. Where to find more information The [employer] of Diabetes and Digestive and Kidney Diseases: [email or web address] Summary  Nonalcoholic fatty liver disease is a condition that causes fat to build up in and around the liver.  Following a healthy diet can help to keep nonalcoholic fatty liver disease under control. Your diet should be rich in fruits, vegetables, whole grains, and lean proteins.  Limit your intake of saturated fat. Saturated fat is found in foods that come from animals, including meat and dairy products such as butter, cheese, and whole milk.  This diet promotes weight loss, helps to control blood  sugar levels, and helps to improve the way that the body uses insulin. This information is not intended to replace advice given to you by your health care provider. Make sure you discuss any questions you have with your health care provider. Document Revised: 09/03/2018 Document Reviewed: 06/03/2018 Elsevier Patient Education  2021 [employer].

## 2020-08-30 NOTE — Progress Notes (Signed)
Referring Provider: Ignatius Specking, MD Primary Care Physician:  Patient, No Pcp Per (Inactive) Primary GI:  Dr. Jena Gauss  Chief Complaint  Patient presents with  . elevated lft    Doing ok, f/u    HPI:   Adam Zimmerman is a 40 y.o. male who presents for 60-month follow-up.  The patient was last seen in our office 06/06/2020 for elevated LFTs, pneumatosis intestinalis, history of diverticulitis, bloating, hematochezia, generalized abdominal pain.  Patient was recently admitted to the hospital for pneumatosis intestinalis and diverticulitis, seen by primary care for transition of care visit and recommended referral to GI.  Admitted to the hospital 03/23/2020 through 03/25/2020 with CT finding early pneumatosis in the jejunal wall with a history of diverticulitis felt likely cause.  Elevated LFTs with AST/ALT of 45/92, otherwise normal.  He was treated with Rocephin and Flagyl and surgery was consulted.  He was septic on admission and leukocytosis resolved with antibiotics, no growth from blood cultures, diet advanced.  He completed his antibiotics as an outpatient and recommended GI referral for colonoscopy in the near future.  Regarding LFTs he denies heavy alcohol use but is obese with a BMI of 32.9.  Right upper quadrant ultrasound during admission found normal gallbladder, diffusely increased heterogenous parenchymal echogenicity of the liver consistent with fatty liver disease and recommended further evaluation by GI.  Of note CT 03/23/2020 found jejunal wall gas, possibly some wall thickening, but no evidence of mesenteric stranding, fluid, or portal venous gas.  At his last visit pain had subsided, does have a sensation of "bloating/gas" on the left side.  No GERD symptoms.  Denied nausea, vomiting.  One episode of hematochezia with known hemorrhoids.  No other overt GI complaints.  Has daily bowel movements consistent with [place] 4, denies NSAIDs.  Not on any medications at that time.   Recommended CBC, CMP, CT of the abdomen and pelvis, colonoscopy, follow-up in 3 months.  Labs completed 06/06/2018 found normal CBC.  CMP with improved AST now normal at 33, improved ALT still mildly elevated at 68.  Otherwise normal.  Colonoscopy completed 06/20/2020 which found entire examined colon normal, distal rectum and anal verge also normal, CT findings likely nonspecific in the setting of acute illness and clinically illness has resolved.  Recommended repeat colonoscopy at age 82 for screening purposes.  Today states doing okay overall. Denies abdominal pain, nausea, vomiting, hematochezia, melena, fever, chills, unintentional weight loss. Denies URI or flu-like symptoms. Denies loss of sense of taste or smell. Denies chest pain, dyspnea, dizziness, lightheadedness, syncope, near syncope. Denies any other upper or lower GI symptoms.  Past Medical History:  Diagnosis Date  . BMI 33.0-33.9,adult   . Fatty liver disease, nonalcoholic     Past Surgical History:  Procedure Laterality Date  . COLONOSCOPY N/A 06/20/2020   Procedure: COLONOSCOPY;  Surgeon: Corbin Ade, MD;  Location: [facility];  Service: Endoscopy;  Laterality: N/A;  8:30am    No current outpatient medications on file.   No current facility-administered medications for this visit.    Allergies as of 08/30/2020 - Review Complete 08/30/2020  Allergen Reaction Noted  . Amoxicillin Hives 04/09/2018    Family History  Problem Relation Age of Onset  . Healthy Mother   . Healthy Father   . Colon cancer Neg Hx     Social History   Socioeconomic History  . Marital status: Single    Spouse name: Not on file  . Number of children:  Not on file  . Years of education: Not on file  . Highest education level: Not on file  Occupational History  . Not on file  Tobacco Use  . Smoking status: Never Smoker  . Smokeless tobacco: Never Used  Vaping Use  . Vaping Use: Never used  Substance and Sexual Activity  .  Alcohol use: Not Currently    Comment: occassional; denied 08/30/20  . Drug use: Never  . Sexual activity: Not on file  Other Topics Concern  . Not on file  Social History Narrative  . Not on file   Social Determinants of Health   Financial Resource Strain: Not on file  Food Insecurity: Not on file  Transportation Needs: Not on file  Physical Activity: Not on file  Stress: Not on file  Social Connections: Not on file    Subjective: Review of Systems  Constitutional: Negative for chills, fever, malaise/fatigue and weight loss.  HENT: Negative for congestion and sore throat.   Respiratory: Negative for cough and shortness of breath.   Cardiovascular: Negative for chest pain and palpitations.  Gastrointestinal: Negative for abdominal pain, blood in stool, diarrhea, melena, nausea and vomiting.  Musculoskeletal: Negative for joint pain and myalgias.  Skin: Negative for rash.  Neurological: Negative for dizziness and weakness.  Endo/Heme/Allergies: Does not bruise/bleed easily.  Psychiatric/Behavioral: Negative for depression. The patient is not nervous/anxious.   All other systems reviewed and are negative.    Objective: BP 131/84   Pulse 83   Temp (!) 97.5 F (36.4 C) (Temporal)   Ht 5\' 6"  (1.676 m)   Wt 213 lb 12.8 oz (97 kg)   BMI 34.51 kg/m  Physical Exam Vitals and nursing note reviewed.  Constitutional:      General: He is not in acute distress.    Appearance: Normal appearance. He is obese. He is not ill-appearing, toxic-appearing or diaphoretic.  HENT:     Head: Normocephalic and atraumatic.     Nose: No congestion or rhinorrhea.  Eyes:     General: No scleral icterus. Cardiovascular:     Rate and Rhythm: Normal rate and regular rhythm.     Heart sounds: Normal heart sounds.  Pulmonary:     Effort: Pulmonary effort is normal.     Breath sounds: Normal breath sounds.  Abdominal:     General: Bowel sounds are normal. There is no distension.      Palpations: Abdomen is soft. There is no hepatomegaly, splenomegaly or mass.     Tenderness: There is no abdominal tenderness. There is no guarding or rebound.     Hernia: No hernia is present.  Musculoskeletal:     Cervical back: Neck supple.  Skin:    General: Skin is warm and dry.     Coloration: Skin is not jaundiced.     Findings: No bruising or rash.  Neurological:     General: No focal deficit present.     Mental Status: He is alert and oriented to person, place, and time. Mental status is at baseline.  Psychiatric:        Mood and Affect: Mood normal.        Behavior: Behavior normal.        Thought Content: Thought content normal.      Assessment:  Very pleasant 41 year old male presents to follow-up on previous pneumatosis intestinalis likely due to diverticulitis, elevated LFTs.  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  His LFTs have improved somewhat, ALT still mildly elevated as per  HPI.  At this point is been 3 months since his last labs.  I will check his LFTs again.  Discussed general principles of fatty liver disease treatment including diet, exercise, weight loss, blood pressure control, blood sugar control, cholesterol control.  Offered to make a referral to nutritionist/dietitian for medical weight loss clinic if desired in the future.  Follow-up in 6 months.   Plan: 1. Diet, exercise, Elita Boone management as per above 2. Recheck LFTs today 3. Follow-up in 6 months    Thank you for allowing [place] to participate in the care of Adam Zimmerman  Adam Mcglothen, DNP, AGNP-C Adult & Gerontological Nurse Practitioner [facility]   08/30/2020 9:46 AM   Disclaimer: This note was dictated with voice recognition software. Similar sounding words can inadvertently be transcribed and may not be corrected upon review.

## 2020-08-31 LAB — COMPREHENSIVE METABOLIC PANEL
AG Ratio: 1.7 (calc) (ref 1.0–2.5)
ALT: 82 U/L — ABNORMAL HIGH (ref 9–46)
AST: 38 U/L (ref 10–40)
Albumin: 4.8 g/dL (ref 3.6–5.1)
Alkaline phosphatase (APISO): 50 U/L (ref 36–130)
BUN: 13 mg/dL (ref 7–25)
CO2: 25 mmol/L (ref 20–32)
Calcium: 9.8 mg/dL (ref 8.6–10.3)
Chloride: 105 mmol/L (ref 98–110)
Creat: 0.83 mg/dL (ref 0.60–1.35)
Globulin: 2.9 g/dL (calc) (ref 1.9–3.7)
Glucose, Bld: 96 mg/dL (ref 65–139)
Potassium: 4.5 mmol/L (ref 3.5–5.3)
Sodium: 139 mmol/L (ref 135–146)
Total Bilirubin: 0.7 mg/dL (ref 0.2–1.2)
Total Protein: 7.7 g/dL (ref 6.1–8.1)

## 2021-03-05 ENCOUNTER — Encounter: Payer: Self-pay | Admitting: Gastroenterology

## 2021-03-05 NOTE — Progress Notes (Signed)
Referring Provider: No ref. provider found Primary Care Physician:  Glenda Chroman, MD Primary GI Physician: Dr. Gala Romney  Chief Complaint  Patient presents with   abdominal discomfort    Feels like he might have infection in abd    HPI:   Adam Zimmerman is a 40 y.o. male presenting today for 22-monthfollow-up on elevated LFTs/fatty liver with acute concerns for left sided abdominal pain.   Last seen in our office 08/30/2020. He was doing well at that time. Recommended rechecking LFTs and he was counseled on fatty liver treatment including diet, exercise, weight loss, blood pressure control, blood sugar control, cholesterol control.  CMP 08/30/2020 with ALT increased to 82 from 68 three months prior, but within prior fluctuations.  AST, alk phos, total bilirubin within normal limits.   Today:  Patient reports new onset left neck/upper abdominal pain Tuesday with associated nausea without vomiting.  Pain is constant, like a bloating sensation, 5/10 in severity.  Does not radiate.  No pain during the night.  Denies any worsening of symptoms with meals.  States eating sometimes eases his pain off, but never resolves.  Bowels are moving well without constipation, diarrhea, BRBPR, melena.  Denies heartburn symptoms reflux, dysphagia.  Overall, he states the symptoms are exactly the same as his symptoms in October last year when he was diagnosed with small bowel pneumatosis.  Denies NSAIDs.   Has been more active. Losing weight. Eating 1 meal a day due to being busy at work.  No family history of liver disease.  Drank 6-8 beer last month.  No alcohol this month.  Used to drink 12+ per weekend. Stopped heavy drinking about 1.5 years ago.    History of possible small bowel pneumatosis in October 2021, empirically treated with antibiotics.  Follow-up colonoscopy January 2022 with entire examined colon normal, distal rectum and anal verge also normal.  Stated CT findings were likely nonspecific in the  setting of acute illness and clinically illness has resolved.  Recommended repeat colonoscopy at age [age]for screening purposes.  No prior evaluation of elevated LFTs aside from RUQ UKoreain October 2021 with findings most consistent with fatty liver.  Past Medical History:  Diagnosis Date   BMI 33.0-33.9,adult    Elevated LFTs    Fatty liver disease, nonalcoholic    Pneumatosis intestinalis 02/2020   possible small bowel pneumatosis. Follow-up colonoscopy Jan 2022 entirely normal. Stated CT findings were non-specific.    Past Surgical History:  Procedure Laterality Date   COLONOSCOPY N/A 06/20/2020   Surgeon: RDaneil Dolin MD;  entire examined colon normal, distal rectum and anal verge also normal.  Repeat at age 40    No current outpatient medications on file.   No current facility-administered medications for this visit.    Allergies as of 03/07/2021 - Review Complete 03/07/2021  Allergen Reaction Noted   Amoxicillin Hives 04/09/2018    Family History  Problem Relation Age of Onset   Healthy Mother    Healthy Father    Colon cancer Neg Hx    Liver disease Neg Hx     Social History   Socioeconomic History   Marital status: Single    Spouse name: Not on file   Number of children: Not on file   Years of education: Not on file   Highest education level: Not on file  Occupational History   Not on file  Tobacco Use   Smoking status: Never   Smokeless tobacco: Never  Vaping Use   Vaping Use: Never used  Substance and Sexual Activity   Alcohol use: Yes    Comment: occ; 6-8 beer per month or less.   Drug use: Never   Sexual activity: Not on file  Other Topics Concern   Not on file  Social History Narrative   Not on file   Social Determinants of Health   Financial Resource Strain: Not on file  Food Insecurity: Not on file  Transportation Needs: Not on file  Physical Activity: Not on file  Stress: Not on file  Social Connections: Not on file    Review  of Systems: Gen: Denies fever, chills, cold or flulike symptoms, presyncope, syncope. CV: Denies chest pain, palpitations. Resp: Denies dyspnea or cough. GI: See HPI GU: Denies dysuria, hematuria, urinary frequency. Heme: Denies bruising, bleeding, and enlarged lymph nodes.  Physical Exam: BP 122/73   Pulse 76   Temp (!) 97.3 F (36.3 C) (Temporal)   Ht _0  (1.676 m)   Wt 199 lb 9.6 oz (90.5 kg)   BMI 32.22 kg/m  General:   Alert and oriented. No distress noted. Pleasant and cooperative.  Head:  Normocephalic and atraumatic. Eyes:  Conjuctiva clear without scleral icterus. Heart:  S1, S2 present without murmurs appreciated. Lungs:  Clear to auscultation bilaterally. No wheezes, rales, or rhonchi. No distress.  Abdomen:  +BS, soft, and non-distended. Mild TTP in LUQ and LLQ, though more prominent in LLQ. No rebound or guarding. No HSM or masses noted. Msk:  Symmetrical without gross deformities. Normal posture. Extremities:  Without edema. Neurologic:  Alert and  oriented x4 Psych:  Normal mood and affect.    Assessment:  40 year old male with history of elevated LFTs, fatty liver, small bowel pneumatosis in October 2021 of unknown etiology treated empirically with antibiotics with good clinical improvement presenting today for acute onset constant left sided abdominal pain with associated nausea without vomiting x 2 days. Describes pain as primarily left upper quadrant, but on exam, he has TTP in the LUQ and increased TTP in the LLQ. Notably, pain improves somewhat with meals, but never resolves.  Denies change in bowel habits, brbpr, melena, GERD symptoms, or NSAID use.  He does drink alcohol occasionally with total of 6-8 beer last month.  No alcohol this month.  Etiology is unclear. Concerned about patient's history of small bowel pneumatosis of unknown etiology with report of the same symptoms today. Additional considerations include diverticulitis though this seems less likely  with no diverticulosis noted on colonoscopy in January. Can't rule out gastritis, PUD, H pylori contributing to LUQ pain. Symptoms are less consistent with pancreatitis.   Elevated LFTs:  Mild.  Dates back at least to 2019.  Most recent labs 08/30/2020 with ALT 82 (H), AST 38, alk phos 50, T bili 0.7.  Prior evaluation with ultrasound revealing fatty liver. No serologic work-up thus far.  He does have history of heavy weekend alcohol use, but has decreased alcohol significantly over the last 1.5 years.  May drink 6-8 beer per month, or less.  No significant risk factors for hepatitis, but I have recommended ruling this out along with screening for hemachromatosis. Notably, he has lost about 14 pounds over the last 6 months through increased activity and decreased caloric intake and was congratulated on this.   Plan: CT A/P with contrast ASAP. CBC, CMP, iron panel with ferritin, lipase, hepatitis C antibody, hepatitis B surface antigen, hepatitis B core antibody, hepatitis B surface antibody, hepatitis A antibody.  Patient declined antiemetic.  Further recommendations to follow.    Aliene Altes, PA-C [facility] 03/07/2021

## 2021-03-07 ENCOUNTER — Encounter: Payer: Self-pay | Admitting: Gastroenterology

## 2021-03-07 ENCOUNTER — Ambulatory Visit: Payer: Self-pay | Admitting: Gastroenterology

## 2021-03-07 ENCOUNTER — Other Ambulatory Visit: Payer: Self-pay

## 2021-03-07 VITALS — BP 122/73 | HR 76 | Temp 97.3°F | Ht 66.0 in | Wt 199.6 lb

## 2021-03-07 DIAGNOSIS — R7401 Elevation of levels of liver transaminase levels: Secondary | ICD-10-CM

## 2021-03-07 DIAGNOSIS — R1032 Left lower quadrant pain: Secondary | ICD-10-CM

## 2021-03-07 DIAGNOSIS — R110 Nausea: Secondary | ICD-10-CM

## 2021-03-07 DIAGNOSIS — R1012 Left upper quadrant pain: Secondary | ICD-10-CM

## 2021-03-07 NOTE — Patient Instructions (Signed)
We will arrange for you to have a CT scan at [facility].  Please also have blood work completed at [facility].  We will call you with further recommendations.  I am sorry you are not feeling well!  Ermalinda Memos, PA-C [facility]

## 2021-03-14 ENCOUNTER — Ambulatory Visit (HOSPITAL_COMMUNITY): Payer: Self-pay

## 2021-04-23 ENCOUNTER — Emergency Department (HOSPITAL_COMMUNITY)
Admission: EM | Admit: 2021-04-23 | Discharge: 2021-04-23 | Disposition: A | Discharge status: Home / Self Care | Payer: Self-pay | Attending: Emergency Medicine | Admitting: Emergency Medicine

## 2021-04-23 ENCOUNTER — Encounter (HOSPITAL_COMMUNITY): Payer: Self-pay | Admitting: *Deleted

## 2021-04-23 DIAGNOSIS — K625 Hemorrhage of anus and rectum: Secondary | ICD-10-CM

## 2021-04-23 DIAGNOSIS — R1012 Left upper quadrant pain: Secondary | ICD-10-CM

## 2021-04-23 LAB — COMPREHENSIVE METABOLIC PANEL
ALT: 34 U/L (ref 0–44)
AST: 25 U/L (ref 15–41)
Albumin: 4.7 g/dL (ref 3.5–5.0)
Alkaline Phosphatase: 45 U/L (ref 38–126)
Anion gap: 8 (ref 5–15)
BUN: 16 mg/dL (ref 6–20)
CO2: 25 mmol/L (ref 22–32)
Calcium: 9.3 mg/dL (ref 8.9–10.3)
Chloride: 104 mmol/L (ref 98–111)
Creatinine, Ser: 0.98 mg/dL (ref 0.61–1.24)
GFR, Estimated: >60 mL/min (ref >60)
Glucose, Bld: 96 mg/dL (ref 70–99)
Potassium: 3.9 mmol/L (ref 3.5–5.1)
Sodium: 137 mmol/L (ref 135–145)
Total Bilirubin: 0.7 mg/dL (ref 0.3–1.2)
Total Protein: 8.1 g/dL (ref 6.5–8.1)

## 2021-04-23 LAB — CBC
HCT: 43 % (ref 39.0–52.0)
Hemoglobin: 14.6 g/dL (ref 13.0–17.0)
MCH: 31 pg (ref 26.0–34.0)
MCHC: 34 g/dL (ref 30.0–36.0)
MCV: 91.3 fL (ref 80.0–100.0)
Platelets: 289 10*3/uL (ref 150–400)
RBC: 4.71 MIL/uL (ref 4.22–5.81)
RDW: 12.9 % (ref 11.5–15.5)
WBC: 7.7 10*3/uL (ref 4.0–10.5)
nRBC: 0 % (ref 0.0–0.2)

## 2021-04-23 LAB — URINALYSIS, ROUTINE W REFLEX MICROSCOPIC
Bilirubin Urine: NEGATIVE
Glucose, UA: NEGATIVE mg/dL
Hgb urine dipstick: NEGATIVE
Ketones, ur: NEGATIVE mg/dL
Leukocytes,Ua: NEGATIVE
Nitrite: NEGATIVE
Protein, ur: NEGATIVE mg/dL
Specific Gravity, Urine: 1.02 (ref 1.005–1.030)
pH: 6 (ref 5.0–8.0)

## 2021-04-23 LAB — POC OCCULT BLOOD, ED: Occult Blood, Stool #1: POSITIVE — AB

## 2021-04-23 LAB — LIPASE, BLOOD: Lipase: 24 U/L (ref 11–51)

## 2021-04-23 MED ORDER — CIPROFLOXACIN HCL 500 MG PO TABS: 500.0000 mg | ORAL_TABLET | Freq: Two times a day (BID) | ORAL | 0 refills | Status: AC

## 2021-04-23 MED ORDER — METRONIDAZOLE 500 MG PO TABS: 500.0000 mg | ORAL_TABLET | Freq: Two times a day (BID) | ORAL | 0 refills | Status: AC

## 2021-04-23 NOTE — Discharge Instructions (Signed)
Contact a health care provider if: Your pain does not improve. Your bowel movements do not return to normal. Get help right away if: Your pain gets worse. Your symptoms do not get better with treatment. Your symptoms suddenly get worse. You have a fever. You vomit more than one time.

## 2021-04-23 NOTE — ED Provider Notes (Signed)
[facility] EMERGENCY DEPARTMENT Provider Note   CSN: [id number] Arrival date & time: 04/23/21  1115     History Chief Complaint  Patient presents with   Abdominal Pain    Adam Zimmerman is a 40 y.o. male.  With significant past medical history including nonalcoholic fatty liver disease, history of pneumatosis intestinalis secondary to diverticulitis and microscopic perforation and hematochezia who presents emergency department chief complaint of abdominal pain and hematochezia.  Patient states that 2 days ago he began having intermittent left upper quadrant abdominal pain which has progressively worsened.  He states this feels exactly the same as his last bout of diverticulitis.  He called his PCP asking for antibiotics however was told he needed to come for evaluation.  He denies fevers, chills, nausea, vomiting, diarrhea.  Patient is also had 2 days of bright red blood per rectum.  He states he has had it in the past and this was attributed to hemorrhoids however it has not lasted quite this long.  He has been using Preparation H suppositories without significant improvement.  He denies lightheadedness, shortness of breath.   Abdominal Pain     Past Medical History:  Diagnosis Date   BMI 33.0-33.9,adult    Elevated LFTs    Fatty liver disease, nonalcoholic    Pneumatosis intestinalis 02/2020   possible small bowel pneumatosis. Follow-up colonoscopy Jan 2022 entirely normal. Stated CT findings were non-specific.    Patient Active Problem List   Diagnosis Date Noted   LLQ abdominal pain 03/07/2021   LUQ abdominal pain 03/07/2021   Nausea without vomiting 03/07/2021   NAFLD (nonalcoholic fatty liver disease) 47/65/4650   Pneumatosis intestinalis 06/06/2020   History of diverticulitis 06/06/2020   Bloating 06/06/2020   Hematochezia 06/06/2020   Elevated transaminase level 03/23/2020   Encounter for screening for HIV 03/23/2020    Past Surgical History:  Procedure  Laterality Date   COLONOSCOPY N/A 06/20/2020   Surgeon: Corbin Ade, MD;  entire examined colon normal, distal rectum and anal verge also normal.  Repeat at age 89.       Family History  Problem Relation Age of Onset   Healthy Mother    Healthy Father    Colon cancer Neg Hx    Liver disease Neg Hx     Social History   Tobacco Use   Smoking status: Never   Smokeless tobacco: Never  Vaping Use   Vaping Use: Never used  Substance Use Topics   Alcohol use: Yes    Comment: occ; 6-8 beer per month or less.   Drug use: Never    Home Medications Prior to Admission medications   Not on File    Allergies    Amoxicillin  Review of Systems   Review of Systems  Gastrointestinal:  Positive for abdominal pain.  Ten systems reviewed and are negative for acute change, except as noted in the HPI.   Physical Exam Updated Vital Signs BP 140/77 (BP Location: Right Arm)   Pulse 85   Temp 98.7 F (37.1 C) (Oral)   Resp 16   Ht 5\' 6"  (1.676 m)   Wt 92.8 kg   SpO2 97%   BMI 33.01 kg/m   Physical Exam Vitals and nursing note reviewed.  Constitutional:      General: He is not in acute distress.    Appearance: He is well-developed. He is not diaphoretic.  HENT:     Head: Normocephalic and atraumatic.  Eyes:  General: No scleral icterus.    Conjunctiva/sclera: Conjunctivae normal.  Cardiovascular:     Rate and Rhythm: Normal rate and regular rhythm.     Heart sounds: Normal heart sounds.  Pulmonary:     Effort: Pulmonary effort is normal. No respiratory distress.     Breath sounds: Normal breath sounds.  Abdominal:     Palpations: Abdomen is soft.     Tenderness: There is no abdominal tenderness.  Genitourinary:    Comments: Digital Rectal Exam reveals sphincter with good tone. No external hemorrhoids. No masses or fissures. Stool color is brown with no overt blood. +FOBT Musculoskeletal:     Cervical back: Normal range of motion and neck supple.  Skin:     General: Skin is warm and dry.  Neurological:     Mental Status: He is alert.  Psychiatric:        Behavior: Behavior normal.    ED Results / Procedures / Treatments   Labs (all labs ordered are listed, but only abnormal results are displayed) Labs Reviewed  LIPASE, BLOOD  COMPREHENSIVE METABOLIC PANEL  CBC  URINALYSIS, ROUTINE W REFLEX MICROSCOPIC    EKG None  Radiology No results found.  Procedures Procedures   Medications Ordered in ED Medications - No data to display  ED Course  I have reviewed the triage vital signs and the nursing notes.  Pertinent labs & imaging results that were available during my care of the patient were reviewed by me and considered in my medical decision making (see chart for details).    MDM Rules/Calculators/A&P                         {40 year old male who presents emergency department chief complaint of left upper quadrant pain and hematochezia. The differential diagnosis for generalized abdominal pain includes, but is not limited to AAA, gastroenteritis, appendicitis, Bowel obstruction, Bowel perforation. Gastroparesis, DKA, Hernia, Inflammatory bowel disease, mesenteric ischemia, pancreatitis, peritonitis SBP, volvulus. I reviewed the patient's labs which show no elevated white blood cell count, CMP shows no elevated transaminases.  Lipase within normal limit.  Patient has a benign abdominal exam here in the emergency department.  He has minimal left upper quadrant tenderness.  He is afebrile and hemodynamically stable. I do not feel that he needs to progress with CT scan.  Patient is in agreement.  Will discharge with Cipro and Flagyl as patient has amoxicillin allergy.  He has no active vomiting, severe pain.  Patient also having rectal bleeding.  Likely secondary to internal hemorrhoids.  He does heavy lifting at work.  He is having no evidence of symptomatic anemia, normotensive, normal heart rate and hemoglobin is 14.6.  At this time  patient is advised to continue using suppositories.  He may follow-up in the outpatient setting with his GI specialist.  Discussed outpatient follow-up and return precautions.  Patient otherwise appropriate for discharge at this time. Final Clinical Impression(s) / ED Diagnoses Final diagnoses:  None    Rx / DC Orders ED Discharge Orders     None        Arthor Captain, PA-C 04/23/21 1403    Bethann Berkshire, MD 04/24/21 1649

## 2021-04-23 NOTE — ED Triage Notes (Signed)
Left lower quadrant pain onset 3 days ago

## 2022-01-17 IMAGING — DX DG CHEST 1V PORT
1 series · 1 of 1 positions shown · non-contrast
Comparison: 03/23/2020

CLINICAL DATA: Sepsis.

EXAM:
PORTABLE CHEST 1 VIEW

[chest]
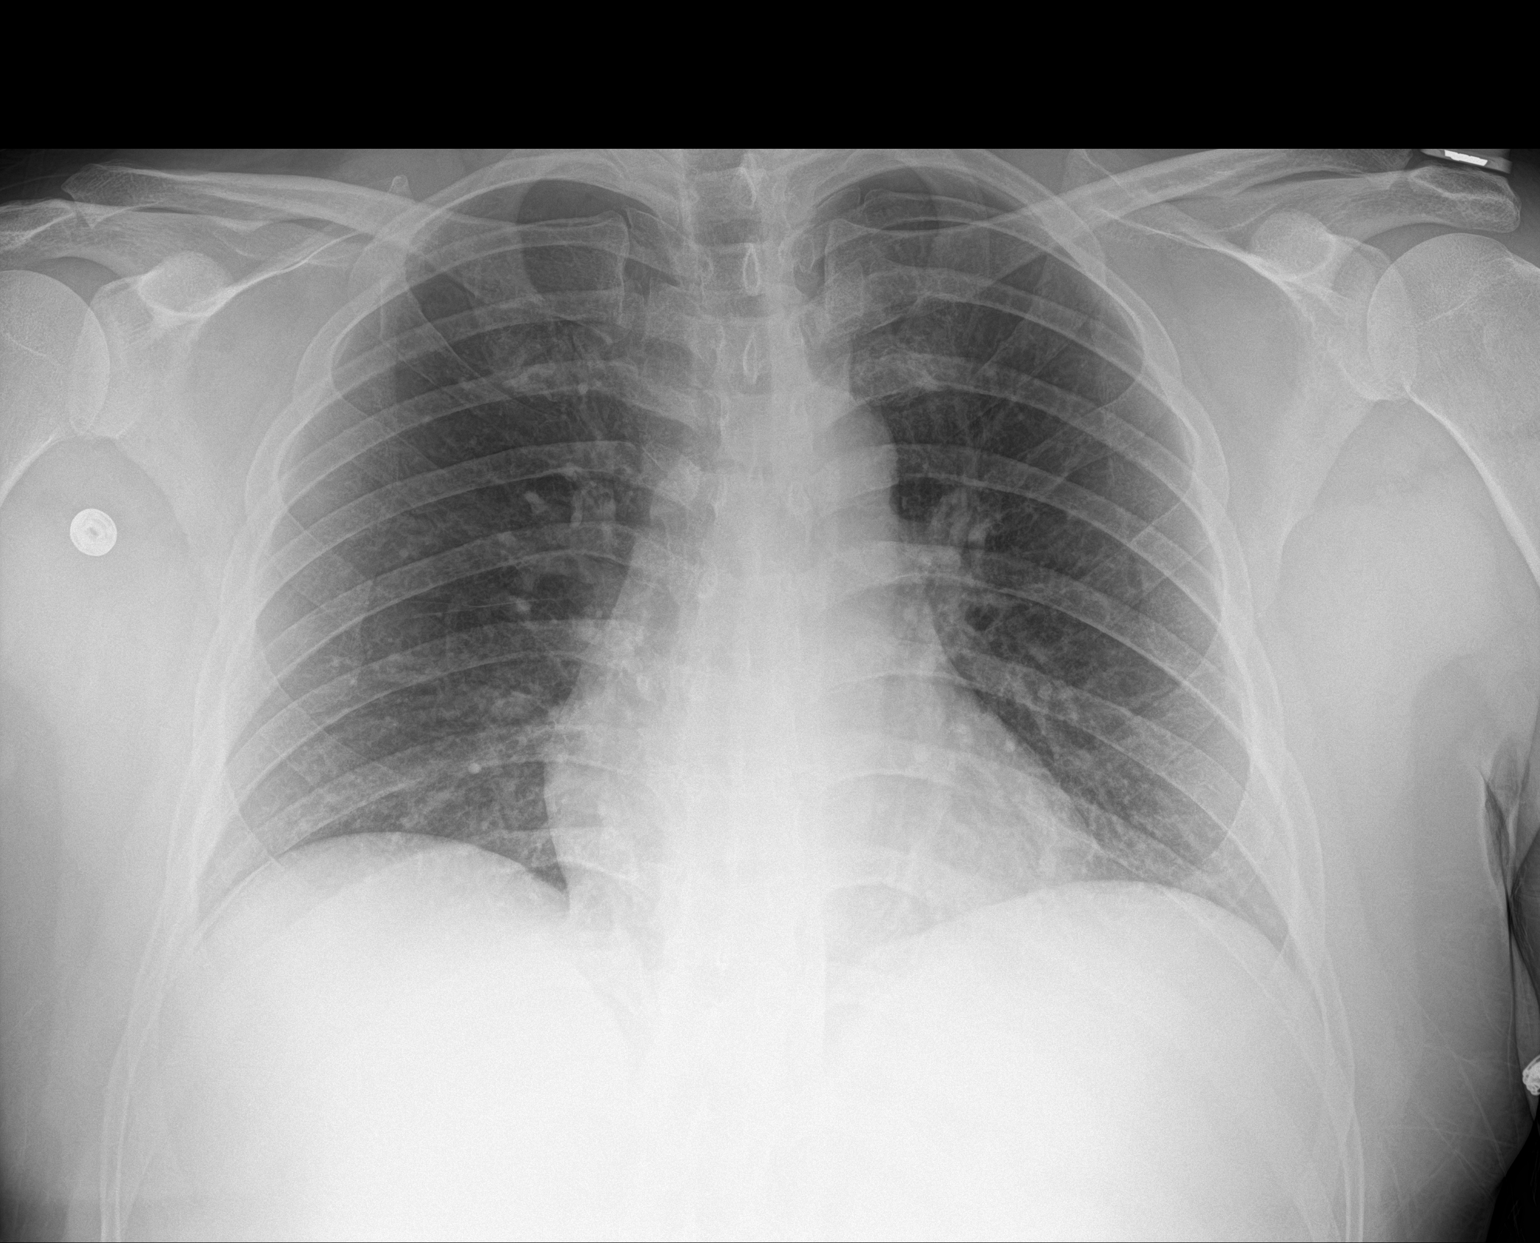

[1 of 1 positions shown; findings below may reference images not displayed]

FINDINGS: The cardiac silhouette, mediastinal and hilar contours are normal.
The lungs are clear. No pleural effusion. The bony thorax is intact.
IMPRESSION: No acute cardiopulmonary findings.

## 2022-05-23 IMAGING — CT CT ABD-PELV W/ CM
2 of 4 series · 16 of 46 positions shown, 18 images · IV contrast (Omnipaque or Isovue)
Comparison: None.

CLINICAL DATA: Diverticulitis suspected, history of diverticulitis
and pneumatosis [DATE]

EXAM:
CT ABDOMEN AND PELVIS WITH CONTRAST
TECHNIQUE: Multidetector CT imaging of the abdomen and pelvis was performed
using the standard protocol following bolus administration of
intravenous contrast.
CONTRAST:  100mL OMNIPAQUE IOHEXOL 300 MG/ML SOLN, additional oral
enteric contrast

[Series 2: axial st · axial · 0.80mm/px · z∈[+958,+1413]mm · 13 of 101 slices shown, 15 images]
[im 5/101  soft-tissue]
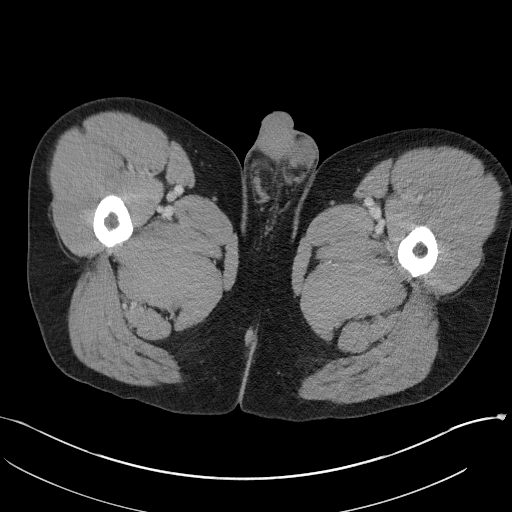
[im 5/101  bone]
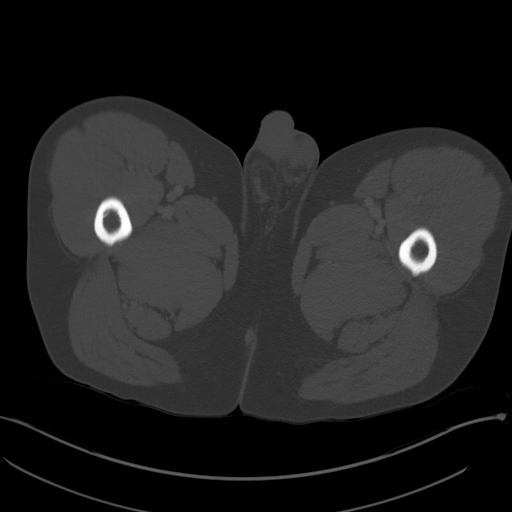
[im 14/101  soft-tissue]
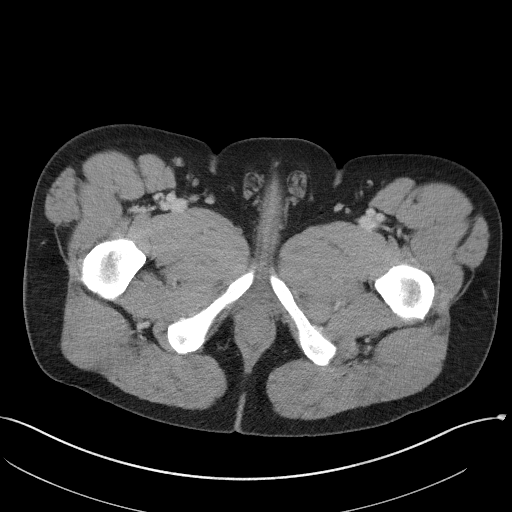
[im 22/101  soft-tissue]
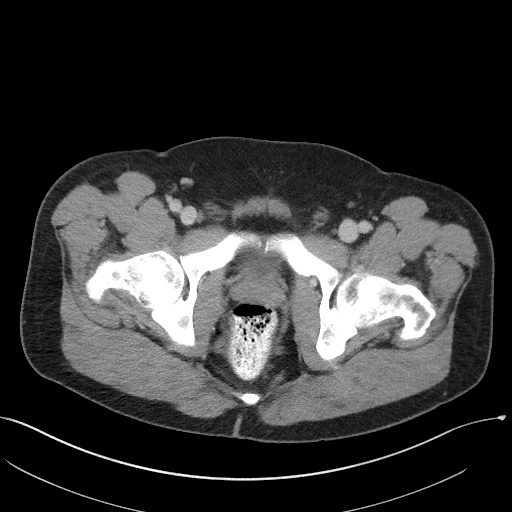
[im 27/101  soft-tissue]
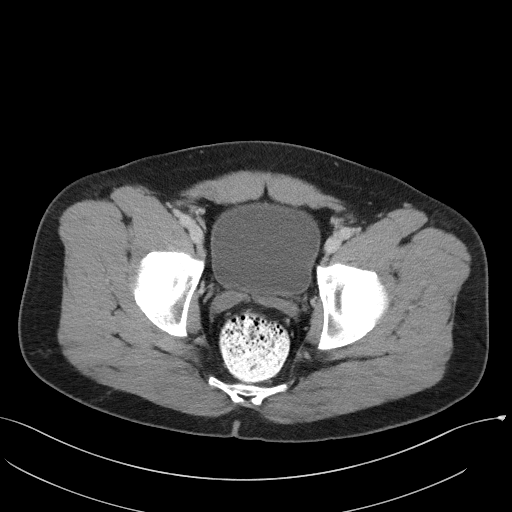
[im 35/101  soft-tissue]
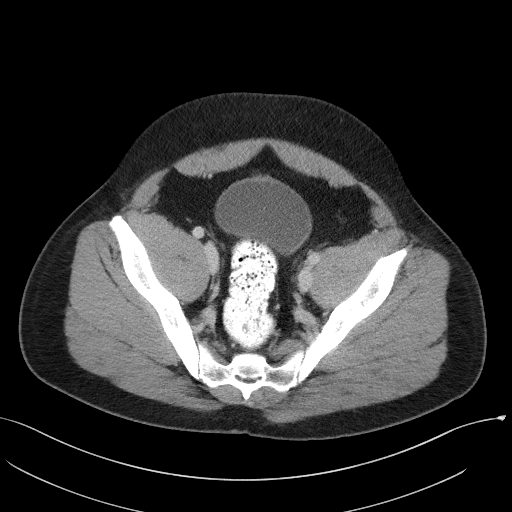
[im 44/101  soft-tissue]
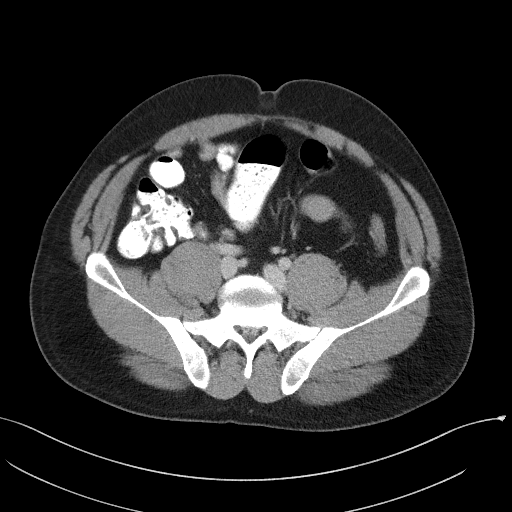
[im 53/101  soft-tissue]
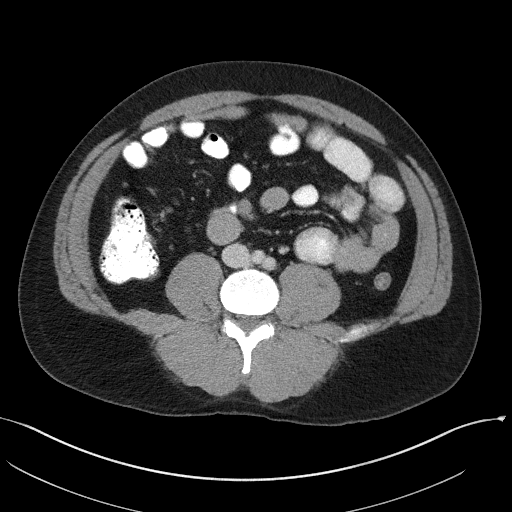
[im 57/101  soft-tissue]
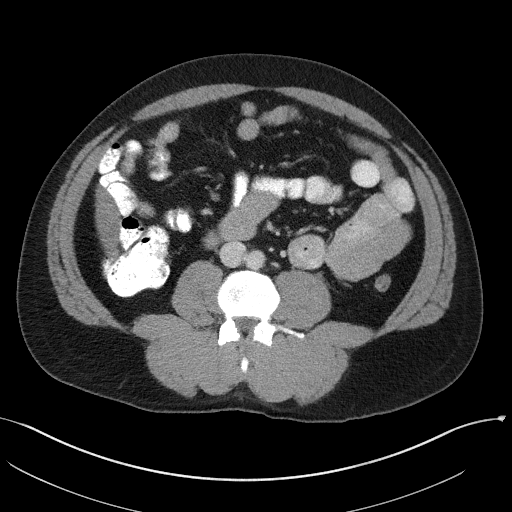
[im 66/101  soft-tissue]
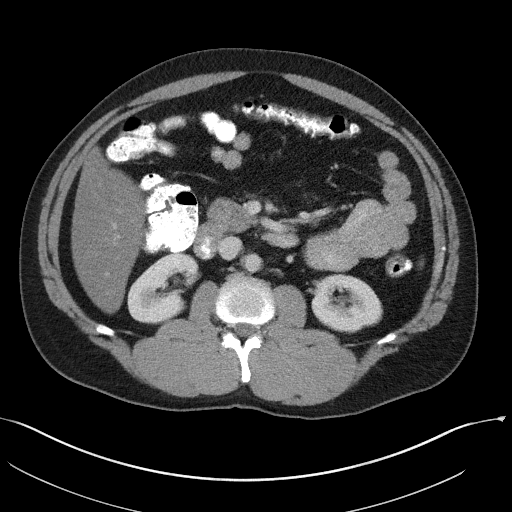
[im 66/101  bone]
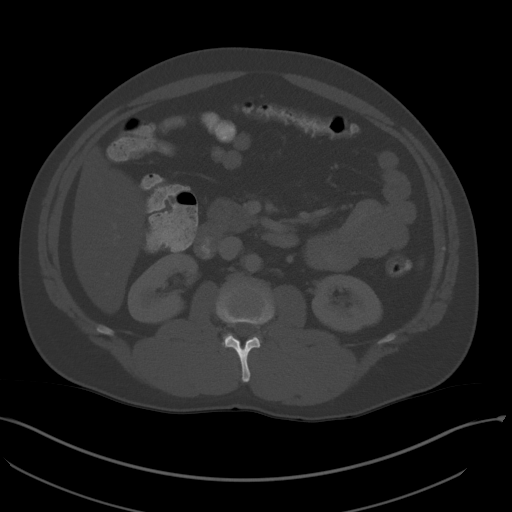
[im 74/101  soft-tissue]
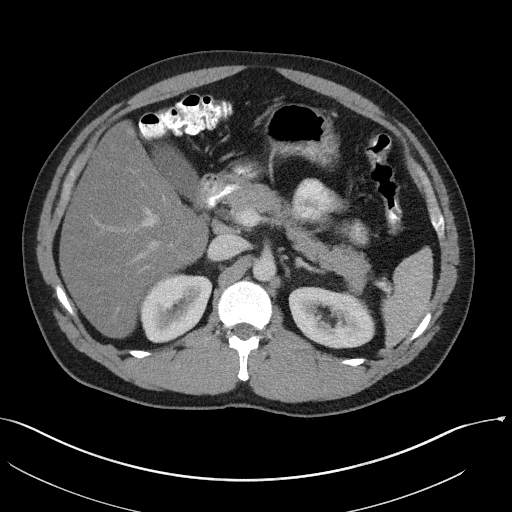
[im 79/101  soft-tissue]
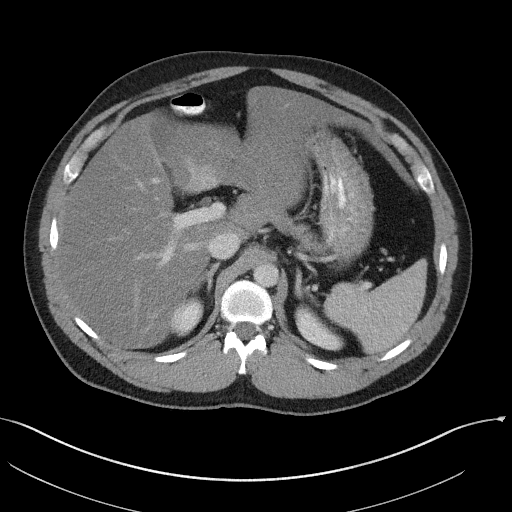
[im 87/101  soft-tissue]
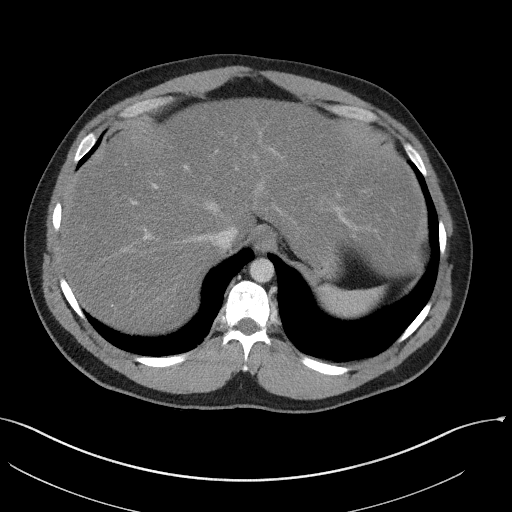
[im 96/101  soft-tissue]
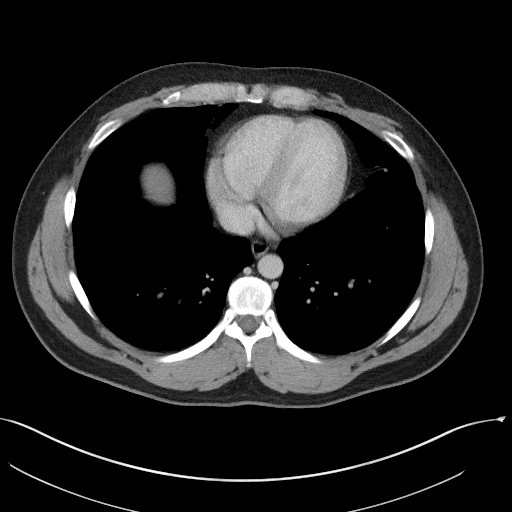

[Series 5: coronal st · coronal · 0.80mm/px · 3 of 109 slices shown]
[im 37/109  soft-tissue]
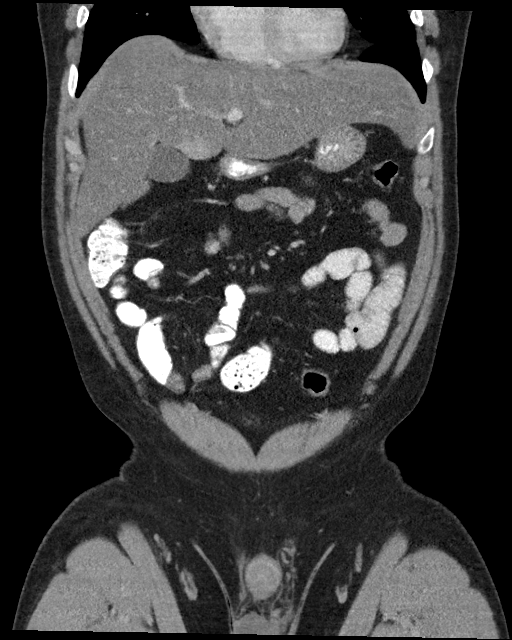
[im 49/109  soft-tissue]
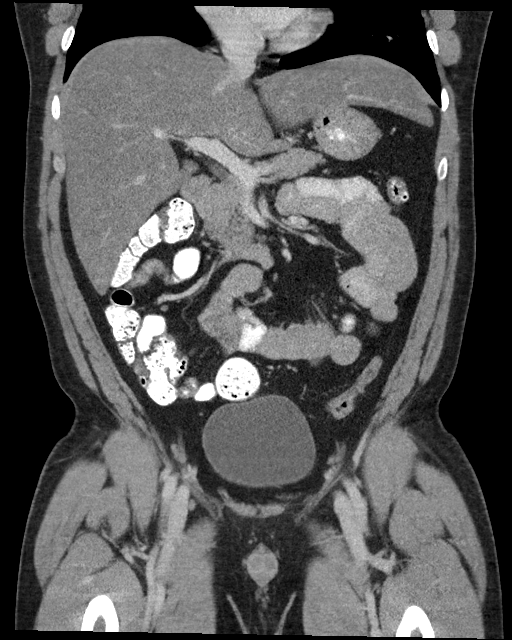
[im 61/109  soft-tissue]
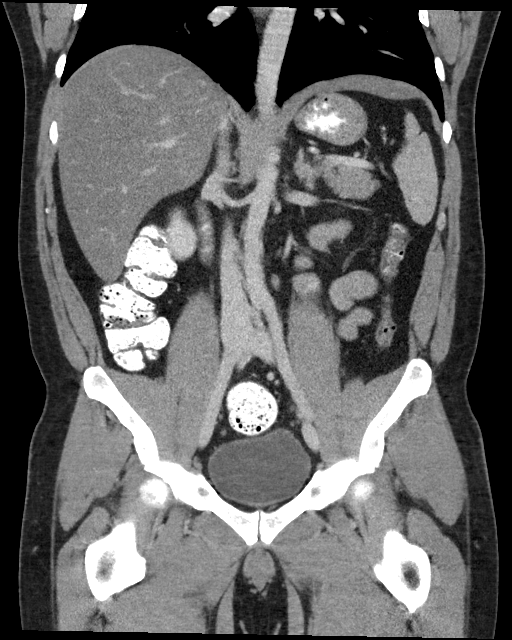

[16 of 46 positions shown; findings below may reference images not displayed]

FINDINGS: Lower chest: No acute abnormality.

Hepatobiliary: No solid liver abnormality is seen. Hepatic
steatosis. No gallstones, gallbladder wall thickening, or biliary
dilatation.

Pancreas: Unremarkable. No pancreatic ductal dilatation or
surrounding inflammatory changes.

Spleen: Normal in size without significant abnormality.

Adrenals/Urinary Tract: Adrenal glands are unremarkable. Kidneys are
normal, without renal calculi, solid lesion, or hydronephrosis.
Bladder is unremarkable.

Stomach/Bowel: Stomach is within normal limits. Appendix appears
normal. No evidence of bowel wall thickening, distention, or
inflammatory changes. Minimal diverticulosis of the sigmoid colon
without evidence of acute diverticulitis (series 2, image 64).

Vascular/Lymphatic: No significant vascular findings are present. No
enlarged abdominal or pelvic lymph nodes.

Reproductive: No mass or other significant abnormality.

Other: No abdominal wall hernia or abnormality. No abdominopelvic
ascites.

Musculoskeletal: No acute or significant osseous findings.
IMPRESSION: 1. Minimal diverticulosis of the sigmoid colon without evidence of
acute diverticulitis.
2. Hepatic steatosis.

## 2022-08-21 ENCOUNTER — Other Ambulatory Visit: Payer: Self-pay

## 2022-08-21 ENCOUNTER — Emergency Department (HOSPITAL_COMMUNITY)
Admission: EM | Admit: 2022-08-21 | Discharge: 2022-08-22 | Disposition: A | Discharge status: Home / Self Care | Payer: Managed Care, Other (non HMO) | Attending: Emergency Medicine | Admitting: Emergency Medicine

## 2022-08-21 ENCOUNTER — Encounter (HOSPITAL_COMMUNITY): Payer: Self-pay

## 2022-08-21 ENCOUNTER — Emergency Department (HOSPITAL_COMMUNITY): Payer: Managed Care, Other (non HMO)

## 2022-08-21 DIAGNOSIS — Z1152 Encounter for screening for COVID-19: Secondary | ICD-10-CM | POA: Diagnosis not present

## 2022-08-21 DIAGNOSIS — R1111 Vomiting without nausea: Secondary | ICD-10-CM | POA: Diagnosis present

## 2022-08-21 DIAGNOSIS — M791 Myalgia, unspecified site: Secondary | ICD-10-CM | POA: Diagnosis not present

## 2022-08-21 LAB — COMPREHENSIVE METABOLIC PANEL
ALT: 52 U/L — ABNORMAL HIGH (ref 0–44)
AST: 44 U/L — ABNORMAL HIGH (ref 15–41)
Albumin: 4.3 g/dL (ref 3.5–5.0)
Alkaline Phosphatase: 53 U/L (ref 38–126)
Anion gap: 9 (ref 5–15)
BUN: 19 mg/dL (ref 6–20)
CO2: 23 mmol/L (ref 22–32)
Calcium: 9 mg/dL (ref 8.9–10.3)
Chloride: 105 mmol/L (ref 98–111)
Creatinine, Ser: 1.06 mg/dL (ref 0.61–1.24)
GFR, Estimated: >60 mL/min (ref >60)
Glucose, Bld: 121 mg/dL — ABNORMAL HIGH (ref 70–99)
Potassium: 3.4 mmol/L — ABNORMAL LOW (ref 3.5–5.1)
Sodium: 137 mmol/L (ref 135–145)
Total Bilirubin: 0.7 mg/dL (ref 0.3–1.2)
Total Protein: 7.4 g/dL (ref 6.5–8.1)

## 2022-08-21 LAB — CBC WITH DIFFERENTIAL/PLATELET
Abs Immature Granulocytes: 0.01 10*3/uL (ref 0.00–0.07)
Basophils Absolute: 0 10*3/uL (ref 0.0–0.1)
Basophils Relative: 0 %
Eosinophils Absolute: 0 10*3/uL (ref 0.0–0.5)
Eosinophils Relative: 0 %
HCT: 43.7 % (ref 39.0–52.0)
Hemoglobin: 14.6 g/dL (ref 13.0–17.0)
Immature Granulocytes: 0 %
Lymphocytes Relative: 6 %
Lymphs Abs: 0.4 10*3/uL — ABNORMAL LOW (ref 0.7–4.0)
MCH: 29.7 pg (ref 26.0–34.0)
MCHC: 33.4 g/dL (ref 30.0–36.0)
MCV: 88.8 fL (ref 80.0–100.0)
Monocytes Absolute: 0 10*3/uL — ABNORMAL LOW (ref 0.1–1.0)
Monocytes Relative: 1 %
Neutro Abs: 7 10*3/uL (ref 1.7–7.7)
Neutrophils Relative %: 93 %
Platelets: 220 10*3/uL (ref 150–400)
RBC: 4.92 MIL/uL (ref 4.22–5.81)
RDW: 13.4 % (ref 11.5–15.5)
WBC: 7.5 10*3/uL (ref 4.0–10.5)
nRBC: 0 % (ref 0.0–0.2)

## 2022-08-21 NOTE — ED Triage Notes (Signed)
Pt presents with generalized body aches, chills, N/V that started after eating tonight. Pt took APAP at 20:00 this evening.

## 2022-08-22 LAB — SARS CORONAVIRUS 2 BY RT PCR: SARS Coronavirus 2 by RT PCR: NEGATIVE

## 2022-08-22 LAB — URINALYSIS, ROUTINE W REFLEX MICROSCOPIC
Bilirubin Urine: NEGATIVE
Glucose, UA: NEGATIVE mg/dL
Hgb urine dipstick: NEGATIVE
Ketones, ur: NEGATIVE mg/dL
Nitrite: NEGATIVE
Protein, ur: NEGATIVE mg/dL
Specific Gravity, Urine: 1.017 (ref 1.005–1.030)
pH: 5 (ref 5.0–8.0)

## 2022-08-22 LAB — LACTIC ACID, PLASMA
Lactic Acid, Venous: 2 mmol/L (ref 0.5–1.9)
Lactic Acid, Venous: 2.4 mmol/L (ref 0.5–1.9)

## 2022-08-22 MED ORDER — ONDANSETRON 4 MG PO TBDP: 4.0000 mg | ORAL_TABLET | Freq: Three times a day (TID) | ORAL | 0 refills | Status: AC | PRN

## 2022-08-22 MED ORDER — ACETAMINOPHEN 500 MG PO TABS
1000.0000 mg | ORAL_TABLET | Freq: Once | ORAL | Status: AC
  Administered 2022-08-22: 1000 mg via ORAL
  Filled 2022-08-22: qty 2

## 2022-08-22 MED ORDER — ONDANSETRON 4 MG PO TBDP
4.0000 mg | ORAL_TABLET | Freq: Once | ORAL | Status: AC
  Administered 2022-08-22: 4 mg via ORAL
  Filled 2022-08-22: qty 1

## 2022-08-22 NOTE — Discharge Instructions (Signed)
You were evaluated in the Emergency Department and after careful evaluation, we did not find any emergent condition requiring admission or further testing in the hospital.  Your exam/testing today is overall reassuring.  Symptoms likely due to a stomach bug or viral illness.  Use the Zofran medication at home as needed.  Plenty of fluids and rest.  Please return to the Emergency Department if you experience any worsening of your condition.   Thank you for allowing [place] to be a part of your care.

## 2022-08-22 NOTE — ED Provider Notes (Signed)
[facility] Emergency Department Provider Note MRN:  [id number]  Arrival date & time: 08/22/22     Chief Complaint   Generalized Body Aches   History of Present Illness   Adam Zimmerman is a 42 y.o. year-old male with a history of fatty liver disease presenting to the ED with chief complaint of bodyaches.  Patient felt well today.  Had a meal at 8 PM.  Shortly after felt chills, body aches.  Nausea, 2 episodes of vomiting.  No chest pain, no abdominal pain, no diarrhea.  Feels a bit better at this time.  Review of Systems  A thorough review of systems was obtained and all systems are negative except as noted in the HPI and PMH.   Patient's Health History    Past Medical History:  Diagnosis Date   BMI 33.0-33.9,adult    Elevated LFTs    Fatty liver disease, nonalcoholic    Pneumatosis intestinalis 02/2020   possible small bowel pneumatosis. Follow-up colonoscopy Jan 2022 entirely normal. Stated CT findings were non-specific.    Past Surgical History:  Procedure Laterality Date   COLONOSCOPY N/A 06/20/2020   Surgeon: Daneil Dolin, MD;  entire examined colon normal, distal rectum and anal verge also normal.  Repeat at age 10.    Family History  Problem Relation Age of Onset   Healthy Mother    Healthy Father    Colon cancer Neg Hx    Liver disease Neg Hx     Social History   Socioeconomic History   Marital status: Single    Spouse name: Not on file   Number of children: Not on file   Years of education: Not on file   Highest education level: Not on file  Occupational History   Not on file  Tobacco Use   Smoking status: Never   Smokeless tobacco: Never  Vaping Use   Vaping Use: Never used  Substance and Sexual Activity   Alcohol use: Yes    Comment: occ; 6-8 beer per month or less.   Drug use: Never   Sexual activity: Not on file  Other Topics Concern   Not on file  Social History Narrative   Not on file   Social Determinants of  Health   Financial Resource Strain: Not on file  Food Insecurity: Not on file  Transportation Needs: Not on file  Physical Activity: Not on file  Stress: Not on file  Social Connections: Not on file  Intimate Partner Violence: Not on file     Physical Exam   Vitals:   08/22/22 0100 08/22/22 0130  BP: 113/61 105/62  Pulse: (!) 103 98  Resp: 20 20  Temp:    SpO2: 98% 96%    CONSTITUTIONAL: Well-appearing, NAD NEURO/PSYCH:  Alert and oriented x 3, no focal deficits EYES:  eyes equal and reactive ENT/NECK:  no LAD, no JVD CARDIO: Regular rate, well-perfused, normal S1 and S2 PULM:  CTAB no wheezing or rhonchi GI/GU:  non-distended, non-tender MSK/SPINE:  No gross deformities, no edema SKIN:  no rash, atraumatic   *Additional and/or pertinent findings included in MDM below  Diagnostic and Interventional Summary    EKG Interpretation  Date/Time:    Ventricular Rate:    PR Interval:    QRS Duration:   QT Interval:    QTC Calculation:   R Axis:     Text Interpretation:         Labs Reviewed  LACTIC ACID, PLASMA - Abnormal;  Notable for the following components:      Result Value   Lactic Acid, Venous 2.4 (*)    All other components within normal limits  LACTIC ACID, PLASMA - Abnormal; Notable for the following components:   Lactic Acid, Venous 2.0 (*)    All other components within normal limits  COMPREHENSIVE METABOLIC PANEL - Abnormal; Notable for the following components:   Potassium 3.4 (*)    Glucose, Bld 121 (*)    AST 44 (*)    ALT 52 (*)    All other components within normal limits  CBC WITH DIFFERENTIAL/PLATELET - Abnormal; Notable for the following components:   Lymphs Abs 0.4 (*)    Monocytes Absolute 0.0 (*)    All other components within normal limits  URINALYSIS, ROUTINE W REFLEX MICROSCOPIC - Abnormal; Notable for the following components:   Leukocytes,Ua TRACE (*)    Bacteria, UA RARE (*)    All other components within normal limits  SARS  CORONAVIRUS 2 BY RT PCR    DG Chest 2 View  Final Result      Medications  ondansetron (ZOFRAN-ODT) disintegrating tablet 4 mg (4 mg Oral Given 08/22/22 0050)  acetaminophen (TYLENOL) tablet 1,000 mg (1,000 mg Oral Given 08/22/22 0050)     Procedures  /  Critical Care Procedures  ED Course and Medical Decision Making  Initial Impression and Ddx Suspect food poisoning or viral illness.  No abdominal pain, abdomen completely soft and nontender, and so nothing to suggest cholecystitis or appendicitis or other surgical emergency.  No URI symptoms.  Patient is mildly tachycardic, oral temp 100.1.  Will provide fluids orally, antinausea medicine, Tylenol, reassess.  Past medical/surgical history that increases complexity of ED encounter: Fatty liver disease  Interpretation of Diagnostics I personally reviewed the laboratory assessment and my interpretation is as follows: Minimal LFT elevation, history of the same.  Otherwise mild lactate elevation in the setting of likely dehydration.  No other significant blood count or electrolyte disturbance.    Patient Reassessment and Ultimate Disposition/Management     Patient tolerating p.o. without issue, continues to deny any pain, really no symptoms at this time.  Repeat abdominal exam is reassuring, no McBurney's point tenderness, no Murphy sign.  Lactate downtrending with p.o. fluids.  Appropriate for discharge.  Patient management required discussion with the following services or consulting groups:  None  Complexity of Problems Addressed Acute illness or injury that poses threat of life of bodily function  Additional Data Reviewed and Analyzed Further history obtained from: Further history from spouse/family member  Additional Factors Impacting ED Encounter Risk Prescriptions  Barth Kirks. Sedonia Small, Dotsero [email or web address]  Final Clinical Impressions(s) / ED Diagnoses      ICD-10-CM   1. Vomiting without nausea, unspecified vomiting type  R11.11       ED Discharge Orders          Ordered    ondansetron (ZOFRAN-ODT) 4 MG disintegrating tablet  Every 8 hours PRN        08/22/22 0147             Discharge Instructions Discussed with and Provided to Patient:     Discharge Instructions      You were evaluated in the Emergency Department and after careful evaluation, we did not find any emergent condition requiring admission or further testing in the hospital.  Your exam/testing today is overall reassuring.  Symptoms likely due to a stomach bug or  viral illness.  Use the Zofran medication at home as needed.  Plenty of fluids and rest.  Please return to the Emergency Department if you experience any worsening of your condition.   Thank you for allowing [place] to be a part of your care.       Maudie Flakes, MD 08/22/22 [phone]

## 2022-08-22 NOTE — ED Notes (Signed)
Pt ambulated to the bathroom unassisted.  

## 2023-11-03 ENCOUNTER — Ambulatory Visit: Payer: Self-pay | Admitting: Physician Assistant
